# Patient Record
Sex: Female | Born: 1950
Health system: Southern US, Community
[De-identification: ages and names within clinical notes are randomized; demographics above are authoritative.]

## PROBLEM LIST (undated history)

## (undated) DIAGNOSIS — F329 Major depressive disorder, single episode, unspecified: Secondary | ICD-10-CM

## (undated) DIAGNOSIS — F419 Anxiety disorder, unspecified: Secondary | ICD-10-CM

## (undated) DIAGNOSIS — M79606 Pain in leg, unspecified: Secondary | ICD-10-CM

## (undated) DIAGNOSIS — E7800 Pure hypercholesterolemia, unspecified: Secondary | ICD-10-CM

## (undated) DIAGNOSIS — F32A Depression, unspecified: Secondary | ICD-10-CM

## (undated) DIAGNOSIS — I10 Essential (primary) hypertension: Secondary | ICD-10-CM

## (undated) DIAGNOSIS — G8929 Other chronic pain: Secondary | ICD-10-CM

## (undated) DIAGNOSIS — C439 Malignant melanoma of skin, unspecified: Secondary | ICD-10-CM

## (undated) DIAGNOSIS — I2510 Atherosclerotic heart disease of native coronary artery without angina pectoris: Secondary | ICD-10-CM

## (undated) DIAGNOSIS — K219 Gastro-esophageal reflux disease without esophagitis: Secondary | ICD-10-CM

## (undated) DIAGNOSIS — M25552 Pain in left hip: Secondary | ICD-10-CM

## (undated) DIAGNOSIS — M1990 Unspecified osteoarthritis, unspecified site: Secondary | ICD-10-CM

## (undated) DIAGNOSIS — E66812 Obesity, class 2: Secondary | ICD-10-CM

## (undated) DIAGNOSIS — M545 Low back pain, unspecified: Secondary | ICD-10-CM

## (undated) DIAGNOSIS — E669 Obesity, unspecified: Secondary | ICD-10-CM

## (undated) DIAGNOSIS — M549 Dorsalgia, unspecified: Secondary | ICD-10-CM

## (undated) HISTORY — DX: Depression, unspecified: F32.A

## (undated) HISTORY — DX: Pure hypercholesterolemia, unspecified: E78.00

## (undated) HISTORY — DX: Essential (primary) hypertension: I10

## (undated) HISTORY — PX: BACK SURGERY: SHX140

## (undated) HISTORY — DX: Anxiety disorder, unspecified: F41.9

## (undated) HISTORY — PX: MELANOMA EXCISION: SHX5266

## (undated) HISTORY — PX: TUBAL LIGATION: SHX77

## (undated) HISTORY — PX: DILATION AND CURETTAGE OF UTERUS: SHX78

## (undated) HISTORY — PX: POSTERIOR FUSION CERVICAL SPINE: SUR628

## (undated) HISTORY — PX: OOPHORECTOMY: SHX86

## (undated) HISTORY — DX: Major depressive disorder, single episode, unspecified: F32.9

---

## 1998-11-08 ENCOUNTER — Emergency Department (HOSPITAL_COMMUNITY): Admission: EM | Admit: 1998-11-08 | Discharge: 1998-11-08 | Payer: Self-pay | Admitting: *Deleted

## 1998-11-09 ENCOUNTER — Encounter: Payer: Self-pay | Admitting: Emergency Medicine

## 2000-05-04 ENCOUNTER — Other Ambulatory Visit: Admission: RE | Admit: 2000-05-04 | Discharge: 2000-05-04 | Payer: Self-pay | Admitting: Obstetrics and Gynecology

## 2000-10-30 ENCOUNTER — Encounter: Payer: Self-pay | Admitting: Obstetrics and Gynecology

## 2000-10-30 ENCOUNTER — Ambulatory Visit (HOSPITAL_COMMUNITY): Admission: RE | Admit: 2000-10-30 | Discharge: 2000-10-30 | Payer: Self-pay | Admitting: Obstetrics and Gynecology

## 2009-07-11 DIAGNOSIS — F329 Major depressive disorder, single episode, unspecified: Secondary | ICD-10-CM

## 2009-07-11 DIAGNOSIS — G4700 Insomnia, unspecified: Secondary | ICD-10-CM

## 2009-07-11 DIAGNOSIS — R059 Cough, unspecified: Secondary | ICD-10-CM

## 2009-07-11 DIAGNOSIS — F411 Generalized anxiety disorder: Secondary | ICD-10-CM

## 2009-07-11 DIAGNOSIS — E785 Hyperlipidemia, unspecified: Secondary | ICD-10-CM | POA: Insufficient documentation

## 2009-07-11 DIAGNOSIS — I10 Essential (primary) hypertension: Secondary | ICD-10-CM

## 2009-07-11 DIAGNOSIS — F3289 Other specified depressive episodes: Secondary | ICD-10-CM | POA: Insufficient documentation

## 2009-07-11 DIAGNOSIS — R05 Cough: Secondary | ICD-10-CM

## 2009-07-11 DIAGNOSIS — G5600 Carpal tunnel syndrome, unspecified upper limb: Secondary | ICD-10-CM

## 2009-07-11 DIAGNOSIS — G2581 Restless legs syndrome: Secondary | ICD-10-CM

## 2009-07-11 DIAGNOSIS — M549 Dorsalgia, unspecified: Secondary | ICD-10-CM

## 2009-07-11 DIAGNOSIS — K219 Gastro-esophageal reflux disease without esophagitis: Secondary | ICD-10-CM | POA: Insufficient documentation

## 2009-07-11 HISTORY — DX: Carpal tunnel syndrome, unspecified upper limb: G56.00

## 2009-07-11 HISTORY — DX: Insomnia, unspecified: G47.00

## 2009-07-11 HISTORY — DX: Generalized anxiety disorder: F41.1

## 2009-07-11 HISTORY — DX: Essential (primary) hypertension: I10

## 2009-07-11 HISTORY — DX: Restless legs syndrome: G25.81

## 2009-07-11 HISTORY — DX: Dorsalgia, unspecified: M54.9

## 2009-07-11 HISTORY — DX: Cough, unspecified: R05.9

## 2009-07-12 ENCOUNTER — Ambulatory Visit: Payer: Self-pay | Admitting: Pulmonary Disease

## 2009-07-13 ENCOUNTER — Encounter: Payer: Self-pay | Admitting: Pulmonary Disease

## 2009-07-24 ENCOUNTER — Telehealth (INDEPENDENT_AMBULATORY_CARE_PROVIDER_SITE_OTHER): Payer: Self-pay | Admitting: *Deleted

## 2012-01-22 ENCOUNTER — Institutional Professional Consult (permissible substitution): Payer: Self-pay | Admitting: Internal Medicine

## 2013-03-11 DIAGNOSIS — M5030 Other cervical disc degeneration, unspecified cervical region: Secondary | ICD-10-CM | POA: Insufficient documentation

## 2013-03-11 DIAGNOSIS — Z79891 Long term (current) use of opiate analgesic: Secondary | ICD-10-CM | POA: Insufficient documentation

## 2013-03-28 DIAGNOSIS — M5416 Radiculopathy, lumbar region: Secondary | ICD-10-CM | POA: Insufficient documentation

## 2014-12-27 DIAGNOSIS — N760 Acute vaginitis: Secondary | ICD-10-CM | POA: Diagnosis not present

## 2014-12-27 DIAGNOSIS — I10 Essential (primary) hypertension: Secondary | ICD-10-CM | POA: Diagnosis not present

## 2014-12-27 DIAGNOSIS — E8881 Metabolic syndrome: Secondary | ICD-10-CM | POA: Diagnosis not present

## 2014-12-27 DIAGNOSIS — K219 Gastro-esophageal reflux disease without esophagitis: Secondary | ICD-10-CM | POA: Diagnosis not present

## 2014-12-27 DIAGNOSIS — F419 Anxiety disorder, unspecified: Secondary | ICD-10-CM | POA: Diagnosis not present

## 2014-12-27 DIAGNOSIS — E780 Pure hypercholesterolemia: Secondary | ICD-10-CM | POA: Diagnosis not present

## 2014-12-27 DIAGNOSIS — K5900 Constipation, unspecified: Secondary | ICD-10-CM | POA: Diagnosis not present

## 2014-12-27 DIAGNOSIS — G4700 Insomnia, unspecified: Secondary | ICD-10-CM | POA: Diagnosis not present

## 2015-01-24 DIAGNOSIS — K5900 Constipation, unspecified: Secondary | ICD-10-CM | POA: Diagnosis not present

## 2015-01-24 DIAGNOSIS — I10 Essential (primary) hypertension: Secondary | ICD-10-CM | POA: Diagnosis not present

## 2015-01-24 DIAGNOSIS — J209 Acute bronchitis, unspecified: Secondary | ICD-10-CM | POA: Diagnosis not present

## 2015-01-24 DIAGNOSIS — E780 Pure hypercholesterolemia: Secondary | ICD-10-CM | POA: Diagnosis not present

## 2015-01-24 DIAGNOSIS — G4700 Insomnia, unspecified: Secondary | ICD-10-CM | POA: Diagnosis not present

## 2015-01-24 DIAGNOSIS — E8881 Metabolic syndrome: Secondary | ICD-10-CM | POA: Diagnosis not present

## 2015-01-24 DIAGNOSIS — K219 Gastro-esophageal reflux disease without esophagitis: Secondary | ICD-10-CM | POA: Diagnosis not present

## 2015-01-24 DIAGNOSIS — F419 Anxiety disorder, unspecified: Secondary | ICD-10-CM | POA: Diagnosis not present

## 2015-02-21 DIAGNOSIS — I10 Essential (primary) hypertension: Secondary | ICD-10-CM | POA: Diagnosis not present

## 2015-02-21 DIAGNOSIS — J209 Acute bronchitis, unspecified: Secondary | ICD-10-CM | POA: Diagnosis not present

## 2015-02-21 DIAGNOSIS — K5900 Constipation, unspecified: Secondary | ICD-10-CM | POA: Diagnosis not present

## 2015-02-21 DIAGNOSIS — K219 Gastro-esophageal reflux disease without esophagitis: Secondary | ICD-10-CM | POA: Diagnosis not present

## 2015-02-21 DIAGNOSIS — M549 Dorsalgia, unspecified: Secondary | ICD-10-CM | POA: Diagnosis not present

## 2015-02-21 DIAGNOSIS — F419 Anxiety disorder, unspecified: Secondary | ICD-10-CM | POA: Diagnosis not present

## 2015-02-21 DIAGNOSIS — E8881 Metabolic syndrome: Secondary | ICD-10-CM | POA: Diagnosis not present

## 2015-02-21 DIAGNOSIS — E780 Pure hypercholesterolemia: Secondary | ICD-10-CM | POA: Diagnosis not present

## 2015-03-05 DIAGNOSIS — M25512 Pain in left shoulder: Secondary | ICD-10-CM | POA: Diagnosis not present

## 2015-03-05 DIAGNOSIS — S199XXA Unspecified injury of neck, initial encounter: Secondary | ICD-10-CM | POA: Diagnosis not present

## 2015-03-05 DIAGNOSIS — M545 Low back pain: Secondary | ICD-10-CM | POA: Diagnosis not present

## 2015-03-05 DIAGNOSIS — S161XXA Strain of muscle, fascia and tendon at neck level, initial encounter: Secondary | ICD-10-CM | POA: Diagnosis not present

## 2015-03-05 DIAGNOSIS — M542 Cervicalgia: Secondary | ICD-10-CM | POA: Diagnosis not present

## 2015-03-05 DIAGNOSIS — S3992XA Unspecified injury of lower back, initial encounter: Secondary | ICD-10-CM | POA: Diagnosis not present

## 2015-03-05 DIAGNOSIS — S4992XA Unspecified injury of left shoulder and upper arm, initial encounter: Secondary | ICD-10-CM | POA: Diagnosis not present

## 2015-03-05 DIAGNOSIS — S39012A Strain of muscle, fascia and tendon of lower back, initial encounter: Secondary | ICD-10-CM | POA: Diagnosis not present

## 2015-03-05 DIAGNOSIS — E780 Pure hypercholesterolemia: Secondary | ICD-10-CM | POA: Diagnosis not present

## 2015-03-21 DIAGNOSIS — I10 Essential (primary) hypertension: Secondary | ICD-10-CM | POA: Diagnosis not present

## 2015-03-21 DIAGNOSIS — F329 Major depressive disorder, single episode, unspecified: Secondary | ICD-10-CM | POA: Diagnosis not present

## 2015-03-21 DIAGNOSIS — F419 Anxiety disorder, unspecified: Secondary | ICD-10-CM | POA: Diagnosis not present

## 2015-03-21 DIAGNOSIS — K5909 Other constipation: Secondary | ICD-10-CM | POA: Diagnosis not present

## 2015-03-21 DIAGNOSIS — K219 Gastro-esophageal reflux disease without esophagitis: Secondary | ICD-10-CM | POA: Diagnosis not present

## 2015-03-21 DIAGNOSIS — E780 Pure hypercholesterolemia: Secondary | ICD-10-CM | POA: Diagnosis not present

## 2015-03-21 DIAGNOSIS — E8881 Metabolic syndrome: Secondary | ICD-10-CM | POA: Diagnosis not present

## 2015-03-21 DIAGNOSIS — G4700 Insomnia, unspecified: Secondary | ICD-10-CM | POA: Diagnosis not present

## 2015-04-06 ENCOUNTER — Encounter: Payer: Self-pay | Admitting: Diagnostic Neuroimaging

## 2015-04-06 ENCOUNTER — Ambulatory Visit (INDEPENDENT_AMBULATORY_CARE_PROVIDER_SITE_OTHER): Payer: Self-pay | Admitting: Diagnostic Neuroimaging

## 2015-04-06 DIAGNOSIS — S161XXA Strain of muscle, fascia and tendon at neck level, initial encounter: Secondary | ICD-10-CM

## 2015-04-06 DIAGNOSIS — M5416 Radiculopathy, lumbar region: Secondary | ICD-10-CM

## 2015-04-06 DIAGNOSIS — M5010 Cervical disc disorder with radiculopathy, unspecified cervical region: Secondary | ICD-10-CM
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
## 2015-04-18 DIAGNOSIS — M549 Dorsalgia, unspecified: Secondary | ICD-10-CM | POA: Diagnosis not present

## 2015-04-18 DIAGNOSIS — I10 Essential (primary) hypertension: Secondary | ICD-10-CM | POA: Diagnosis not present

## 2015-04-18 DIAGNOSIS — E8881 Metabolic syndrome: Secondary | ICD-10-CM | POA: Diagnosis not present

## 2015-04-18 DIAGNOSIS — E780 Pure hypercholesterolemia: Secondary | ICD-10-CM | POA: Diagnosis not present

## 2015-04-18 DIAGNOSIS — F329 Major depressive disorder, single episode, unspecified: Secondary | ICD-10-CM | POA: Diagnosis not present

## 2015-04-18 DIAGNOSIS — K5909 Other constipation: Secondary | ICD-10-CM | POA: Diagnosis not present

## 2015-04-18 DIAGNOSIS — M542 Cervicalgia: Secondary | ICD-10-CM | POA: Diagnosis not present

## 2015-04-18 DIAGNOSIS — K219 Gastro-esophageal reflux disease without esophagitis: Secondary | ICD-10-CM | POA: Diagnosis not present

## 2015-05-09 ENCOUNTER — Telehealth: Payer: Self-pay | Admitting: Diagnostic Neuroimaging
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
## 2015-05-09 NOTE — Telephone Encounter (Signed)
Patient is calling because she is scheduled for an MRI of the cervical spine and lumbar spine next Sunday. The patient is still having pain in her lt. hip, lt. leg and both shoulders and was wondering if these can also be included in the MRI. Please call and advise. Thank you.

## 2015-05-13 ENCOUNTER — Ambulatory Visit
Admission: RE | Admit: 2015-05-13 | Discharge: 2015-05-13 | Disposition: A | Discharge status: Home / Self Care | Payer: Commercial Managed Care - HMO | Source: Ambulatory Visit | Attending: Diagnostic Neuroimaging | Admitting: Diagnostic Neuroimaging

## 2015-05-13 DIAGNOSIS — S161XXA Strain of muscle, fascia and tendon at neck level, initial encounter: Secondary | ICD-10-CM | POA: Diagnosis not present

## 2015-05-13 DIAGNOSIS — M5416 Radiculopathy, lumbar region: Secondary | ICD-10-CM | POA: Diagnosis not present

## 2015-05-13 DIAGNOSIS — M5010 Cervical disc disorder with radiculopathy, unspecified cervical region: Secondary | ICD-10-CM

## 2015-05-13 MED ORDER — GADOBENATE DIMEGLUMINE 529 MG/ML IV SOLN
15.0000 mL | Freq: Once | INTRAVENOUS | Status: AC | PRN
  Administered 2015-05-13: 15 mL via INTRAVENOUS

## 2015-05-16 DIAGNOSIS — F329 Major depressive disorder, single episode, unspecified: Secondary | ICD-10-CM | POA: Diagnosis not present

## 2015-05-16 DIAGNOSIS — M549 Dorsalgia, unspecified: Secondary | ICD-10-CM | POA: Diagnosis not present

## 2015-05-16 DIAGNOSIS — K219 Gastro-esophageal reflux disease without esophagitis: Secondary | ICD-10-CM | POA: Diagnosis not present

## 2015-05-16 DIAGNOSIS — M542 Cervicalgia: Secondary | ICD-10-CM | POA: Diagnosis not present

## 2015-05-16 DIAGNOSIS — K5909 Other constipation: Secondary | ICD-10-CM | POA: Diagnosis not present

## 2015-05-16 DIAGNOSIS — I10 Essential (primary) hypertension: Secondary | ICD-10-CM | POA: Diagnosis not present

## 2015-05-16 DIAGNOSIS — E8881 Metabolic syndrome: Secondary | ICD-10-CM | POA: Diagnosis not present

## 2015-05-16 DIAGNOSIS — E780 Pure hypercholesterolemia: Secondary | ICD-10-CM | POA: Diagnosis not present

## 2015-05-23 ENCOUNTER — Ambulatory Visit: Payer: Self-pay | Admitting: Diagnostic Neuroimaging

## 2015-05-28 ENCOUNTER — Encounter: Payer: Self-pay | Admitting: Diagnostic Neuroimaging

## 2015-05-28 ENCOUNTER — Ambulatory Visit (INDEPENDENT_AMBULATORY_CARE_PROVIDER_SITE_OTHER): Payer: Self-pay | Admitting: Diagnostic Neuroimaging

## 2015-05-28 VITALS — BP 128/76 | HR 92 | Ht 59.0 in | Wt 164.2 lb

## 2015-05-28 DIAGNOSIS — M5416 Radiculopathy, lumbar region: Secondary | ICD-10-CM

## 2015-05-28 DIAGNOSIS — M5010 Cervical disc disorder with radiculopathy, unspecified cervical region: Secondary | ICD-10-CM
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
## 2015-06-14 DIAGNOSIS — J209 Acute bronchitis, unspecified: Secondary | ICD-10-CM | POA: Diagnosis not present

## 2015-06-14 DIAGNOSIS — E8881 Metabolic syndrome: Secondary | ICD-10-CM | POA: Diagnosis not present

## 2015-06-14 DIAGNOSIS — K219 Gastro-esophageal reflux disease without esophagitis: Secondary | ICD-10-CM | POA: Diagnosis not present

## 2015-06-14 DIAGNOSIS — K5909 Other constipation: Secondary | ICD-10-CM | POA: Diagnosis not present

## 2015-06-14 DIAGNOSIS — E780 Pure hypercholesterolemia: Secondary | ICD-10-CM | POA: Diagnosis not present

## 2015-06-14 DIAGNOSIS — I10 Essential (primary) hypertension: Secondary | ICD-10-CM | POA: Diagnosis not present

## 2015-06-14 DIAGNOSIS — M542 Cervicalgia: Secondary | ICD-10-CM | POA: Diagnosis not present

## 2015-06-14 DIAGNOSIS — M549 Dorsalgia, unspecified: Secondary | ICD-10-CM | POA: Diagnosis not present

## 2015-07-12 DIAGNOSIS — K219 Gastro-esophageal reflux disease without esophagitis: Secondary | ICD-10-CM | POA: Diagnosis not present

## 2015-07-12 DIAGNOSIS — M549 Dorsalgia, unspecified: Secondary | ICD-10-CM | POA: Diagnosis not present

## 2015-07-12 DIAGNOSIS — I10 Essential (primary) hypertension: Secondary | ICD-10-CM | POA: Diagnosis not present

## 2015-07-12 DIAGNOSIS — E8881 Metabolic syndrome: Secondary | ICD-10-CM | POA: Diagnosis not present

## 2015-07-12 DIAGNOSIS — L0390 Cellulitis, unspecified: Secondary | ICD-10-CM | POA: Diagnosis not present

## 2015-07-12 DIAGNOSIS — E780 Pure hypercholesterolemia: Secondary | ICD-10-CM | POA: Diagnosis not present

## 2015-07-12 DIAGNOSIS — M542 Cervicalgia: Secondary | ICD-10-CM | POA: Diagnosis not present

## 2015-07-12 DIAGNOSIS — K5909 Other constipation: Secondary | ICD-10-CM | POA: Diagnosis not present

## 2015-08-10 DIAGNOSIS — K219 Gastro-esophageal reflux disease without esophagitis: Secondary | ICD-10-CM | POA: Diagnosis not present

## 2015-08-10 DIAGNOSIS — F329 Major depressive disorder, single episode, unspecified: Secondary | ICD-10-CM | POA: Diagnosis not present

## 2015-08-10 DIAGNOSIS — E780 Pure hypercholesterolemia: Secondary | ICD-10-CM | POA: Diagnosis not present

## 2015-08-10 DIAGNOSIS — M542 Cervicalgia: Secondary | ICD-10-CM | POA: Diagnosis not present

## 2015-08-10 DIAGNOSIS — I10 Essential (primary) hypertension: Secondary | ICD-10-CM | POA: Diagnosis not present

## 2015-08-10 DIAGNOSIS — K5909 Other constipation: Secondary | ICD-10-CM | POA: Diagnosis not present

## 2015-08-10 DIAGNOSIS — M549 Dorsalgia, unspecified: Secondary | ICD-10-CM | POA: Diagnosis not present

## 2015-08-10 DIAGNOSIS — E8881 Metabolic syndrome: Secondary | ICD-10-CM | POA: Diagnosis not present

## 2015-08-20 DIAGNOSIS — M6281 Muscle weakness (generalized): Secondary | ICD-10-CM | POA: Diagnosis not present

## 2015-08-20 DIAGNOSIS — S161XXS Strain of muscle, fascia and tendon at neck level, sequela: Secondary | ICD-10-CM | POA: Diagnosis not present

## 2015-09-03 DIAGNOSIS — S161XXS Strain of muscle, fascia and tendon at neck level, sequela: Secondary | ICD-10-CM | POA: Diagnosis not present

## 2015-09-03 DIAGNOSIS — M6281 Muscle weakness (generalized): Secondary | ICD-10-CM | POA: Diagnosis not present

## 2015-09-07 DIAGNOSIS — I10 Essential (primary) hypertension: Secondary | ICD-10-CM | POA: Diagnosis not present

## 2015-09-07 DIAGNOSIS — K5909 Other constipation: Secondary | ICD-10-CM | POA: Diagnosis not present

## 2015-09-07 DIAGNOSIS — F419 Anxiety disorder, unspecified: Secondary | ICD-10-CM | POA: Diagnosis not present

## 2015-09-07 DIAGNOSIS — E8881 Metabolic syndrome: Secondary | ICD-10-CM | POA: Diagnosis not present

## 2015-09-07 DIAGNOSIS — M549 Dorsalgia, unspecified: Secondary | ICD-10-CM | POA: Diagnosis not present

## 2015-09-07 DIAGNOSIS — F329 Major depressive disorder, single episode, unspecified: Secondary | ICD-10-CM | POA: Diagnosis not present

## 2015-09-07 DIAGNOSIS — K219 Gastro-esophageal reflux disease without esophagitis: Secondary | ICD-10-CM | POA: Diagnosis not present

## 2015-09-07 DIAGNOSIS — E780 Pure hypercholesterolemia: Secondary | ICD-10-CM | POA: Diagnosis not present

## 2015-09-10 DIAGNOSIS — S161XXS Strain of muscle, fascia and tendon at neck level, sequela: Secondary | ICD-10-CM | POA: Diagnosis not present

## 2015-09-10 DIAGNOSIS — M6281 Muscle weakness (generalized): Secondary | ICD-10-CM | POA: Diagnosis not present

## 2015-10-05 DIAGNOSIS — K219 Gastro-esophageal reflux disease without esophagitis: Secondary | ICD-10-CM | POA: Diagnosis not present

## 2015-10-05 DIAGNOSIS — I10 Essential (primary) hypertension: Secondary | ICD-10-CM | POA: Diagnosis not present

## 2015-10-05 DIAGNOSIS — M542 Cervicalgia: Secondary | ICD-10-CM | POA: Diagnosis not present

## 2015-10-05 DIAGNOSIS — M549 Dorsalgia, unspecified: Secondary | ICD-10-CM | POA: Diagnosis not present

## 2015-10-05 DIAGNOSIS — E7800 Pure hypercholesterolemia, unspecified: Secondary | ICD-10-CM | POA: Diagnosis not present

## 2015-10-05 DIAGNOSIS — K5909 Other constipation: Secondary | ICD-10-CM | POA: Diagnosis not present

## 2015-10-05 DIAGNOSIS — J209 Acute bronchitis, unspecified: Secondary | ICD-10-CM | POA: Diagnosis not present

## 2015-10-05 DIAGNOSIS — E8881 Metabolic syndrome: Secondary | ICD-10-CM | POA: Diagnosis not present

## 2015-11-05 DIAGNOSIS — E8881 Metabolic syndrome: Secondary | ICD-10-CM | POA: Diagnosis not present

## 2015-11-05 DIAGNOSIS — K219 Gastro-esophageal reflux disease without esophagitis: Secondary | ICD-10-CM | POA: Diagnosis not present

## 2015-11-05 DIAGNOSIS — M549 Dorsalgia, unspecified: Secondary | ICD-10-CM | POA: Diagnosis not present

## 2015-11-05 DIAGNOSIS — K5909 Other constipation: Secondary | ICD-10-CM | POA: Diagnosis not present

## 2015-11-05 DIAGNOSIS — Z1389 Encounter for screening for other disorder: Secondary | ICD-10-CM | POA: Diagnosis not present

## 2015-11-05 DIAGNOSIS — E7800 Pure hypercholesterolemia, unspecified: Secondary | ICD-10-CM | POA: Diagnosis not present

## 2015-11-05 DIAGNOSIS — I10 Essential (primary) hypertension: Secondary | ICD-10-CM | POA: Diagnosis not present

## 2015-11-05 DIAGNOSIS — N390 Urinary tract infection, site not specified: Secondary | ICD-10-CM | POA: Diagnosis not present

## 2015-11-30 DIAGNOSIS — M549 Dorsalgia, unspecified: Secondary | ICD-10-CM | POA: Diagnosis not present

## 2015-11-30 DIAGNOSIS — K5909 Other constipation: Secondary | ICD-10-CM | POA: Diagnosis not present

## 2015-11-30 DIAGNOSIS — K219 Gastro-esophageal reflux disease without esophagitis: Secondary | ICD-10-CM | POA: Diagnosis not present

## 2015-11-30 DIAGNOSIS — F419 Anxiety disorder, unspecified: Secondary | ICD-10-CM | POA: Diagnosis not present

## 2015-11-30 DIAGNOSIS — E7800 Pure hypercholesterolemia, unspecified: Secondary | ICD-10-CM | POA: Diagnosis not present

## 2015-11-30 DIAGNOSIS — E8881 Metabolic syndrome: Secondary | ICD-10-CM | POA: Diagnosis not present

## 2015-11-30 DIAGNOSIS — N390 Urinary tract infection, site not specified: Secondary | ICD-10-CM | POA: Diagnosis not present

## 2015-11-30 DIAGNOSIS — I10 Essential (primary) hypertension: Secondary | ICD-10-CM | POA: Diagnosis not present

## 2015-12-28 DIAGNOSIS — E8881 Metabolic syndrome: Secondary | ICD-10-CM | POA: Diagnosis not present

## 2015-12-28 DIAGNOSIS — E7800 Pure hypercholesterolemia, unspecified: Secondary | ICD-10-CM | POA: Diagnosis not present

## 2015-12-28 DIAGNOSIS — M549 Dorsalgia, unspecified: Secondary | ICD-10-CM | POA: Diagnosis not present

## 2015-12-28 DIAGNOSIS — K219 Gastro-esophageal reflux disease without esophagitis: Secondary | ICD-10-CM | POA: Diagnosis not present

## 2015-12-28 DIAGNOSIS — I10 Essential (primary) hypertension: Secondary | ICD-10-CM | POA: Diagnosis not present

## 2015-12-28 DIAGNOSIS — K5909 Other constipation: Secondary | ICD-10-CM | POA: Diagnosis not present

## 2015-12-28 DIAGNOSIS — F419 Anxiety disorder, unspecified: Secondary | ICD-10-CM | POA: Diagnosis not present

## 2015-12-28 DIAGNOSIS — G4700 Insomnia, unspecified: Secondary | ICD-10-CM | POA: Diagnosis not present

## 2015-12-28 DIAGNOSIS — J209 Acute bronchitis, unspecified: Secondary | ICD-10-CM | POA: Diagnosis not present

## 2016-01-25 DIAGNOSIS — K5909 Other constipation: Secondary | ICD-10-CM | POA: Diagnosis not present

## 2016-01-25 DIAGNOSIS — E7800 Pure hypercholesterolemia, unspecified: Secondary | ICD-10-CM | POA: Diagnosis not present

## 2016-01-25 DIAGNOSIS — J209 Acute bronchitis, unspecified: Secondary | ICD-10-CM | POA: Diagnosis not present

## 2016-01-25 DIAGNOSIS — G4700 Insomnia, unspecified: Secondary | ICD-10-CM | POA: Diagnosis not present

## 2016-01-25 DIAGNOSIS — E8881 Metabolic syndrome: Secondary | ICD-10-CM | POA: Diagnosis not present

## 2016-01-25 DIAGNOSIS — F419 Anxiety disorder, unspecified: Secondary | ICD-10-CM | POA: Diagnosis not present

## 2016-01-25 DIAGNOSIS — I10 Essential (primary) hypertension: Secondary | ICD-10-CM | POA: Diagnosis not present

## 2016-01-25 DIAGNOSIS — K219 Gastro-esophageal reflux disease without esophagitis: Secondary | ICD-10-CM | POA: Diagnosis not present

## 2016-01-25 DIAGNOSIS — M549 Dorsalgia, unspecified: Secondary | ICD-10-CM | POA: Diagnosis not present

## 2016-02-21 DIAGNOSIS — I10 Essential (primary) hypertension: Secondary | ICD-10-CM | POA: Diagnosis not present

## 2016-02-21 DIAGNOSIS — K5909 Other constipation: Secondary | ICD-10-CM | POA: Diagnosis not present

## 2016-02-21 DIAGNOSIS — K219 Gastro-esophageal reflux disease without esophagitis: Secondary | ICD-10-CM | POA: Diagnosis not present

## 2016-02-21 DIAGNOSIS — M549 Dorsalgia, unspecified: Secondary | ICD-10-CM | POA: Diagnosis not present

## 2016-02-21 DIAGNOSIS — E8881 Metabolic syndrome: Secondary | ICD-10-CM | POA: Diagnosis not present

## 2016-02-21 DIAGNOSIS — F419 Anxiety disorder, unspecified: Secondary | ICD-10-CM | POA: Diagnosis not present

## 2016-02-21 DIAGNOSIS — G4700 Insomnia, unspecified: Secondary | ICD-10-CM | POA: Diagnosis not present

## 2016-02-21 DIAGNOSIS — N761 Subacute and chronic vaginitis: Secondary | ICD-10-CM | POA: Diagnosis not present

## 2016-02-21 DIAGNOSIS — E7800 Pure hypercholesterolemia, unspecified: Secondary | ICD-10-CM | POA: Diagnosis not present

## 2016-03-19 DIAGNOSIS — N904 Leukoplakia of vulva: Secondary | ICD-10-CM | POA: Insufficient documentation

## 2016-03-19 DIAGNOSIS — B373 Candidiasis of vulva and vagina: Secondary | ICD-10-CM | POA: Diagnosis not present

## 2016-03-19 DIAGNOSIS — B379 Candidiasis, unspecified: Secondary | ICD-10-CM | POA: Diagnosis not present

## 2016-03-20 DIAGNOSIS — F329 Major depressive disorder, single episode, unspecified: Secondary | ICD-10-CM | POA: Diagnosis not present

## 2016-03-20 DIAGNOSIS — K219 Gastro-esophageal reflux disease without esophagitis: Secondary | ICD-10-CM | POA: Diagnosis not present

## 2016-03-20 DIAGNOSIS — M549 Dorsalgia, unspecified: Secondary | ICD-10-CM | POA: Diagnosis not present

## 2016-03-20 DIAGNOSIS — E8881 Metabolic syndrome: Secondary | ICD-10-CM | POA: Diagnosis not present

## 2016-03-20 DIAGNOSIS — G4700 Insomnia, unspecified: Secondary | ICD-10-CM | POA: Diagnosis not present

## 2016-03-20 DIAGNOSIS — F419 Anxiety disorder, unspecified: Secondary | ICD-10-CM | POA: Diagnosis not present

## 2016-03-20 DIAGNOSIS — I10 Essential (primary) hypertension: Secondary | ICD-10-CM | POA: Diagnosis not present

## 2016-03-20 DIAGNOSIS — K5909 Other constipation: Secondary | ICD-10-CM | POA: Diagnosis not present

## 2016-03-20 DIAGNOSIS — E7800 Pure hypercholesterolemia, unspecified: Secondary | ICD-10-CM | POA: Diagnosis not present

## 2016-04-17 DIAGNOSIS — K5909 Other constipation: Secondary | ICD-10-CM | POA: Diagnosis not present

## 2016-04-17 DIAGNOSIS — M549 Dorsalgia, unspecified: Secondary | ICD-10-CM | POA: Diagnosis not present

## 2016-04-17 DIAGNOSIS — E7800 Pure hypercholesterolemia, unspecified: Secondary | ICD-10-CM | POA: Diagnosis not present

## 2016-04-17 DIAGNOSIS — Z79891 Long term (current) use of opiate analgesic: Secondary | ICD-10-CM | POA: Diagnosis not present

## 2016-04-17 DIAGNOSIS — K219 Gastro-esophageal reflux disease without esophagitis: Secondary | ICD-10-CM | POA: Diagnosis not present

## 2016-04-17 DIAGNOSIS — R300 Dysuria: Secondary | ICD-10-CM | POA: Diagnosis not present

## 2016-04-17 DIAGNOSIS — I10 Essential (primary) hypertension: Secondary | ICD-10-CM | POA: Diagnosis not present

## 2016-04-17 DIAGNOSIS — E8881 Metabolic syndrome: Secondary | ICD-10-CM | POA: Diagnosis not present

## 2016-04-17 DIAGNOSIS — L0390 Cellulitis, unspecified: Secondary | ICD-10-CM | POA: Diagnosis not present

## 2016-05-15 DIAGNOSIS — G4700 Insomnia, unspecified: Secondary | ICD-10-CM | POA: Diagnosis not present

## 2016-05-15 DIAGNOSIS — K219 Gastro-esophageal reflux disease without esophagitis: Secondary | ICD-10-CM | POA: Diagnosis not present

## 2016-05-15 DIAGNOSIS — I10 Essential (primary) hypertension: Secondary | ICD-10-CM | POA: Diagnosis not present

## 2016-05-15 DIAGNOSIS — N390 Urinary tract infection, site not specified: Secondary | ICD-10-CM | POA: Diagnosis not present

## 2016-05-15 DIAGNOSIS — F419 Anxiety disorder, unspecified: Secondary | ICD-10-CM | POA: Diagnosis not present

## 2016-05-15 DIAGNOSIS — M549 Dorsalgia, unspecified: Secondary | ICD-10-CM | POA: Diagnosis not present

## 2016-05-15 DIAGNOSIS — E8881 Metabolic syndrome: Secondary | ICD-10-CM | POA: Diagnosis not present

## 2016-05-15 DIAGNOSIS — E7800 Pure hypercholesterolemia, unspecified: Secondary | ICD-10-CM | POA: Diagnosis not present

## 2016-05-15 DIAGNOSIS — K5909 Other constipation: Secondary | ICD-10-CM | POA: Diagnosis not present

## 2016-06-12 DIAGNOSIS — M549 Dorsalgia, unspecified: Secondary | ICD-10-CM | POA: Diagnosis not present

## 2016-06-12 DIAGNOSIS — E8881 Metabolic syndrome: Secondary | ICD-10-CM | POA: Diagnosis not present

## 2016-06-12 DIAGNOSIS — K219 Gastro-esophageal reflux disease without esophagitis: Secondary | ICD-10-CM | POA: Diagnosis not present

## 2016-06-12 DIAGNOSIS — F329 Major depressive disorder, single episode, unspecified: Secondary | ICD-10-CM | POA: Diagnosis not present

## 2016-06-12 DIAGNOSIS — G4700 Insomnia, unspecified: Secondary | ICD-10-CM | POA: Diagnosis not present

## 2016-06-12 DIAGNOSIS — E7800 Pure hypercholesterolemia, unspecified: Secondary | ICD-10-CM | POA: Diagnosis not present

## 2016-06-12 DIAGNOSIS — F419 Anxiety disorder, unspecified: Secondary | ICD-10-CM | POA: Diagnosis not present

## 2016-06-12 DIAGNOSIS — K5909 Other constipation: Secondary | ICD-10-CM | POA: Diagnosis not present

## 2016-06-12 DIAGNOSIS — I10 Essential (primary) hypertension: Secondary | ICD-10-CM | POA: Diagnosis not present

## 2016-07-06 DIAGNOSIS — R0602 Shortness of breath: Secondary | ICD-10-CM | POA: Diagnosis not present

## 2016-07-06 DIAGNOSIS — R0789 Other chest pain: Secondary | ICD-10-CM | POA: Diagnosis not present

## 2016-07-06 DIAGNOSIS — Z981 Arthrodesis status: Secondary | ICD-10-CM | POA: Diagnosis not present

## 2016-07-06 DIAGNOSIS — R079 Chest pain, unspecified: Secondary | ICD-10-CM | POA: Diagnosis not present

## 2016-07-06 DIAGNOSIS — M79605 Pain in left leg: Secondary | ICD-10-CM | POA: Diagnosis not present

## 2016-07-06 DIAGNOSIS — R42 Dizziness and giddiness: Secondary | ICD-10-CM | POA: Diagnosis not present

## 2016-07-10 DIAGNOSIS — E7800 Pure hypercholesterolemia, unspecified: Secondary | ICD-10-CM | POA: Diagnosis not present

## 2016-07-10 DIAGNOSIS — M549 Dorsalgia, unspecified: Secondary | ICD-10-CM | POA: Diagnosis not present

## 2016-07-10 DIAGNOSIS — K5909 Other constipation: Secondary | ICD-10-CM | POA: Diagnosis not present

## 2016-07-10 DIAGNOSIS — G4700 Insomnia, unspecified: Secondary | ICD-10-CM | POA: Diagnosis not present

## 2016-07-10 DIAGNOSIS — K219 Gastro-esophageal reflux disease without esophagitis: Secondary | ICD-10-CM | POA: Diagnosis not present

## 2016-07-10 DIAGNOSIS — F419 Anxiety disorder, unspecified: Secondary | ICD-10-CM | POA: Diagnosis not present

## 2016-07-10 DIAGNOSIS — E8881 Metabolic syndrome: Secondary | ICD-10-CM | POA: Diagnosis not present

## 2016-07-10 DIAGNOSIS — N390 Urinary tract infection, site not specified: Secondary | ICD-10-CM | POA: Diagnosis not present

## 2016-07-10 DIAGNOSIS — I10 Essential (primary) hypertension: Secondary | ICD-10-CM | POA: Diagnosis not present

## 2016-08-07 DIAGNOSIS — M549 Dorsalgia, unspecified: Secondary | ICD-10-CM | POA: Diagnosis not present

## 2016-08-07 DIAGNOSIS — E7800 Pure hypercholesterolemia, unspecified: Secondary | ICD-10-CM | POA: Diagnosis not present

## 2016-08-07 DIAGNOSIS — F419 Anxiety disorder, unspecified: Secondary | ICD-10-CM | POA: Diagnosis not present

## 2016-08-07 DIAGNOSIS — Z79899 Other long term (current) drug therapy: Secondary | ICD-10-CM | POA: Diagnosis not present

## 2016-08-07 DIAGNOSIS — I10 Essential (primary) hypertension: Secondary | ICD-10-CM | POA: Diagnosis not present

## 2016-08-07 DIAGNOSIS — E8881 Metabolic syndrome: Secondary | ICD-10-CM | POA: Diagnosis not present

## 2016-08-07 DIAGNOSIS — N390 Urinary tract infection, site not specified: Secondary | ICD-10-CM | POA: Diagnosis not present

## 2016-08-07 DIAGNOSIS — K5909 Other constipation: Secondary | ICD-10-CM | POA: Diagnosis not present

## 2016-08-07 DIAGNOSIS — K219 Gastro-esophageal reflux disease without esophagitis: Secondary | ICD-10-CM | POA: Diagnosis not present

## 2016-09-04 DIAGNOSIS — K5909 Other constipation: Secondary | ICD-10-CM | POA: Diagnosis not present

## 2016-09-04 DIAGNOSIS — N390 Urinary tract infection, site not specified: Secondary | ICD-10-CM | POA: Diagnosis not present

## 2016-09-04 DIAGNOSIS — M549 Dorsalgia, unspecified: Secondary | ICD-10-CM | POA: Diagnosis not present

## 2016-09-04 DIAGNOSIS — E7800 Pure hypercholesterolemia, unspecified: Secondary | ICD-10-CM | POA: Diagnosis not present

## 2016-09-04 DIAGNOSIS — E8881 Metabolic syndrome: Secondary | ICD-10-CM | POA: Diagnosis not present

## 2016-09-04 DIAGNOSIS — K219 Gastro-esophageal reflux disease without esophagitis: Secondary | ICD-10-CM | POA: Diagnosis not present

## 2016-09-04 DIAGNOSIS — G4700 Insomnia, unspecified: Secondary | ICD-10-CM | POA: Diagnosis not present

## 2016-09-04 DIAGNOSIS — F419 Anxiety disorder, unspecified: Secondary | ICD-10-CM | POA: Diagnosis not present

## 2016-09-04 DIAGNOSIS — I10 Essential (primary) hypertension: Secondary | ICD-10-CM | POA: Diagnosis not present

## 2016-10-02 DIAGNOSIS — K5909 Other constipation: Secondary | ICD-10-CM | POA: Diagnosis not present

## 2016-10-02 DIAGNOSIS — R079 Chest pain, unspecified: Secondary | ICD-10-CM | POA: Diagnosis not present

## 2016-10-02 DIAGNOSIS — E7800 Pure hypercholesterolemia, unspecified: Secondary | ICD-10-CM | POA: Diagnosis not present

## 2016-10-02 DIAGNOSIS — M549 Dorsalgia, unspecified: Secondary | ICD-10-CM | POA: Diagnosis not present

## 2016-10-02 DIAGNOSIS — R300 Dysuria: Secondary | ICD-10-CM | POA: Diagnosis not present

## 2016-10-02 DIAGNOSIS — F419 Anxiety disorder, unspecified: Secondary | ICD-10-CM | POA: Diagnosis not present

## 2016-10-02 DIAGNOSIS — M542 Cervicalgia: Secondary | ICD-10-CM | POA: Diagnosis not present

## 2016-10-02 DIAGNOSIS — I10 Essential (primary) hypertension: Secondary | ICD-10-CM | POA: Diagnosis not present

## 2016-10-02 DIAGNOSIS — K219 Gastro-esophageal reflux disease without esophagitis: Secondary | ICD-10-CM | POA: Diagnosis not present

## 2016-10-22 DIAGNOSIS — R079 Chest pain, unspecified: Secondary | ICD-10-CM | POA: Diagnosis not present

## 2016-10-22 DIAGNOSIS — E782 Mixed hyperlipidemia: Secondary | ICD-10-CM | POA: Diagnosis not present

## 2016-10-24 DIAGNOSIS — E782 Mixed hyperlipidemia: Secondary | ICD-10-CM | POA: Diagnosis not present

## 2016-10-24 DIAGNOSIS — R079 Chest pain, unspecified: Secondary | ICD-10-CM | POA: Diagnosis not present

## 2016-10-30 DIAGNOSIS — Z23 Encounter for immunization: Secondary | ICD-10-CM | POA: Diagnosis not present

## 2016-10-30 DIAGNOSIS — K5909 Other constipation: Secondary | ICD-10-CM | POA: Diagnosis not present

## 2016-10-30 DIAGNOSIS — Z79891 Long term (current) use of opiate analgesic: Secondary | ICD-10-CM | POA: Diagnosis not present

## 2016-10-30 DIAGNOSIS — E8881 Metabolic syndrome: Secondary | ICD-10-CM | POA: Diagnosis not present

## 2016-10-30 DIAGNOSIS — G4700 Insomnia, unspecified: Secondary | ICD-10-CM | POA: Diagnosis not present

## 2016-10-30 DIAGNOSIS — E7800 Pure hypercholesterolemia, unspecified: Secondary | ICD-10-CM | POA: Diagnosis not present

## 2016-10-30 DIAGNOSIS — M549 Dorsalgia, unspecified: Secondary | ICD-10-CM | POA: Diagnosis not present

## 2016-10-30 DIAGNOSIS — K219 Gastro-esophageal reflux disease without esophagitis: Secondary | ICD-10-CM | POA: Diagnosis not present

## 2016-10-30 DIAGNOSIS — I10 Essential (primary) hypertension: Secondary | ICD-10-CM | POA: Diagnosis not present

## 2016-11-04 DIAGNOSIS — R079 Chest pain, unspecified: Secondary | ICD-10-CM | POA: Diagnosis not present

## 2016-11-05 DIAGNOSIS — F419 Anxiety disorder, unspecified: Secondary | ICD-10-CM | POA: Diagnosis not present

## 2016-11-05 DIAGNOSIS — R079 Chest pain, unspecified: Secondary | ICD-10-CM | POA: Diagnosis not present

## 2016-11-05 DIAGNOSIS — E782 Mixed hyperlipidemia: Secondary | ICD-10-CM | POA: Diagnosis not present

## 2016-11-05 DIAGNOSIS — K219 Gastro-esophageal reflux disease without esophagitis: Secondary | ICD-10-CM | POA: Diagnosis not present

## 2016-11-05 DIAGNOSIS — Z79899 Other long term (current) drug therapy: Secondary | ICD-10-CM | POA: Diagnosis not present

## 2016-11-05 DIAGNOSIS — G4700 Insomnia, unspecified: Secondary | ICD-10-CM | POA: Diagnosis not present

## 2016-11-05 DIAGNOSIS — Z981 Arthrodesis status: Secondary | ICD-10-CM | POA: Diagnosis not present

## 2016-11-05 DIAGNOSIS — I7090 Unspecified atherosclerosis: Secondary | ICD-10-CM | POA: Diagnosis not present

## 2016-11-05 DIAGNOSIS — I2510 Atherosclerotic heart disease of native coronary artery without angina pectoris: Secondary | ICD-10-CM | POA: Diagnosis not present

## 2016-11-26 DIAGNOSIS — F419 Anxiety disorder, unspecified: Secondary | ICD-10-CM | POA: Diagnosis not present

## 2016-11-26 DIAGNOSIS — M542 Cervicalgia: Secondary | ICD-10-CM | POA: Diagnosis not present

## 2016-11-26 DIAGNOSIS — I10 Essential (primary) hypertension: Secondary | ICD-10-CM | POA: Diagnosis not present

## 2016-11-26 DIAGNOSIS — K5909 Other constipation: Secondary | ICD-10-CM | POA: Diagnosis not present

## 2016-11-26 DIAGNOSIS — K219 Gastro-esophageal reflux disease without esophagitis: Secondary | ICD-10-CM | POA: Diagnosis not present

## 2016-11-26 DIAGNOSIS — E8881 Metabolic syndrome: Secondary | ICD-10-CM | POA: Diagnosis not present

## 2016-11-26 DIAGNOSIS — Z1389 Encounter for screening for other disorder: Secondary | ICD-10-CM | POA: Diagnosis not present

## 2016-11-26 DIAGNOSIS — E7800 Pure hypercholesterolemia, unspecified: Secondary | ICD-10-CM | POA: Diagnosis not present

## 2016-11-26 DIAGNOSIS — G4700 Insomnia, unspecified: Secondary | ICD-10-CM | POA: Diagnosis not present

## 2016-12-24 DIAGNOSIS — I10 Essential (primary) hypertension: Secondary | ICD-10-CM | POA: Diagnosis not present

## 2016-12-24 DIAGNOSIS — M549 Dorsalgia, unspecified: Secondary | ICD-10-CM | POA: Diagnosis not present

## 2016-12-24 DIAGNOSIS — F419 Anxiety disorder, unspecified: Secondary | ICD-10-CM | POA: Diagnosis not present

## 2016-12-24 DIAGNOSIS — M542 Cervicalgia: Secondary | ICD-10-CM | POA: Diagnosis not present

## 2016-12-24 DIAGNOSIS — K5909 Other constipation: Secondary | ICD-10-CM | POA: Diagnosis not present

## 2016-12-24 DIAGNOSIS — E8881 Metabolic syndrome: Secondary | ICD-10-CM | POA: Diagnosis not present

## 2016-12-24 DIAGNOSIS — E7800 Pure hypercholesterolemia, unspecified: Secondary | ICD-10-CM | POA: Diagnosis not present

## 2016-12-24 DIAGNOSIS — G4700 Insomnia, unspecified: Secondary | ICD-10-CM | POA: Diagnosis not present

## 2016-12-24 DIAGNOSIS — K219 Gastro-esophageal reflux disease without esophagitis: Secondary | ICD-10-CM | POA: Diagnosis not present

## 2017-01-20 DIAGNOSIS — K5909 Other constipation: Secondary | ICD-10-CM | POA: Diagnosis not present

## 2017-01-20 DIAGNOSIS — K219 Gastro-esophageal reflux disease without esophagitis: Secondary | ICD-10-CM | POA: Diagnosis not present

## 2017-01-20 DIAGNOSIS — J209 Acute bronchitis, unspecified: Secondary | ICD-10-CM | POA: Diagnosis not present

## 2017-01-20 DIAGNOSIS — E8881 Metabolic syndrome: Secondary | ICD-10-CM | POA: Diagnosis not present

## 2017-01-20 DIAGNOSIS — I10 Essential (primary) hypertension: Secondary | ICD-10-CM | POA: Diagnosis not present

## 2017-01-20 DIAGNOSIS — G4700 Insomnia, unspecified: Secondary | ICD-10-CM | POA: Diagnosis not present

## 2017-01-20 DIAGNOSIS — M542 Cervicalgia: Secondary | ICD-10-CM | POA: Diagnosis not present

## 2017-01-20 DIAGNOSIS — M549 Dorsalgia, unspecified: Secondary | ICD-10-CM | POA: Diagnosis not present

## 2017-01-20 DIAGNOSIS — E7800 Pure hypercholesterolemia, unspecified: Secondary | ICD-10-CM | POA: Diagnosis not present

## 2017-02-17 DIAGNOSIS — G4700 Insomnia, unspecified: Secondary | ICD-10-CM | POA: Diagnosis not present

## 2017-02-17 DIAGNOSIS — Z79891 Long term (current) use of opiate analgesic: Secondary | ICD-10-CM | POA: Diagnosis not present

## 2017-02-17 DIAGNOSIS — E7800 Pure hypercholesterolemia, unspecified: Secondary | ICD-10-CM | POA: Diagnosis not present

## 2017-02-17 DIAGNOSIS — N390 Urinary tract infection, site not specified: Secondary | ICD-10-CM | POA: Diagnosis not present

## 2017-02-17 DIAGNOSIS — K5909 Other constipation: Secondary | ICD-10-CM | POA: Diagnosis not present

## 2017-02-17 DIAGNOSIS — M549 Dorsalgia, unspecified: Secondary | ICD-10-CM | POA: Diagnosis not present

## 2017-02-17 DIAGNOSIS — I10 Essential (primary) hypertension: Secondary | ICD-10-CM | POA: Diagnosis not present

## 2017-02-17 DIAGNOSIS — F1120 Opioid dependence, uncomplicated: Secondary | ICD-10-CM | POA: Diagnosis not present

## 2017-02-17 DIAGNOSIS — K219 Gastro-esophageal reflux disease without esophagitis: Secondary | ICD-10-CM | POA: Diagnosis not present

## 2017-02-17 DIAGNOSIS — E669 Obesity, unspecified: Secondary | ICD-10-CM | POA: Diagnosis not present

## 2017-03-13 DIAGNOSIS — M542 Cervicalgia: Secondary | ICD-10-CM | POA: Diagnosis not present

## 2017-03-13 DIAGNOSIS — E7800 Pure hypercholesterolemia, unspecified: Secondary | ICD-10-CM | POA: Diagnosis not present

## 2017-03-13 DIAGNOSIS — I10 Essential (primary) hypertension: Secondary | ICD-10-CM | POA: Diagnosis not present

## 2017-03-13 DIAGNOSIS — E8881 Metabolic syndrome: Secondary | ICD-10-CM | POA: Diagnosis not present

## 2017-03-13 DIAGNOSIS — K5909 Other constipation: Secondary | ICD-10-CM | POA: Diagnosis not present

## 2017-03-13 DIAGNOSIS — F419 Anxiety disorder, unspecified: Secondary | ICD-10-CM | POA: Diagnosis not present

## 2017-03-13 DIAGNOSIS — N761 Subacute and chronic vaginitis: Secondary | ICD-10-CM | POA: Diagnosis not present

## 2017-03-13 DIAGNOSIS — K219 Gastro-esophageal reflux disease without esophagitis: Secondary | ICD-10-CM | POA: Diagnosis not present

## 2017-03-13 DIAGNOSIS — F329 Major depressive disorder, single episode, unspecified: Secondary | ICD-10-CM | POA: Diagnosis not present

## 2017-03-23 DIAGNOSIS — Z1231 Encounter for screening mammogram for malignant neoplasm of breast: Secondary | ICD-10-CM | POA: Diagnosis not present

## 2017-04-09 DIAGNOSIS — R7989 Other specified abnormal findings of blood chemistry: Secondary | ICD-10-CM | POA: Diagnosis not present

## 2017-04-09 DIAGNOSIS — I10 Essential (primary) hypertension: Secondary | ICD-10-CM | POA: Diagnosis not present

## 2017-04-09 DIAGNOSIS — M542 Cervicalgia: Secondary | ICD-10-CM | POA: Diagnosis not present

## 2017-04-09 DIAGNOSIS — R945 Abnormal results of liver function studies: Secondary | ICD-10-CM | POA: Diagnosis not present

## 2017-04-09 DIAGNOSIS — E8881 Metabolic syndrome: Secondary | ICD-10-CM | POA: Diagnosis not present

## 2017-04-09 DIAGNOSIS — F419 Anxiety disorder, unspecified: Secondary | ICD-10-CM | POA: Diagnosis not present

## 2017-04-09 DIAGNOSIS — F329 Major depressive disorder, single episode, unspecified: Secondary | ICD-10-CM | POA: Diagnosis not present

## 2017-04-09 DIAGNOSIS — E7800 Pure hypercholesterolemia, unspecified: Secondary | ICD-10-CM | POA: Diagnosis not present

## 2017-04-09 DIAGNOSIS — J209 Acute bronchitis, unspecified: Secondary | ICD-10-CM | POA: Diagnosis not present

## 2017-04-09 DIAGNOSIS — G4700 Insomnia, unspecified: Secondary | ICD-10-CM | POA: Diagnosis not present

## 2017-05-07 DIAGNOSIS — F419 Anxiety disorder, unspecified: Secondary | ICD-10-CM | POA: Diagnosis not present

## 2017-05-07 DIAGNOSIS — M542 Cervicalgia: Secondary | ICD-10-CM | POA: Diagnosis not present

## 2017-05-07 DIAGNOSIS — F329 Major depressive disorder, single episode, unspecified: Secondary | ICD-10-CM | POA: Diagnosis not present

## 2017-05-07 DIAGNOSIS — M549 Dorsalgia, unspecified: Secondary | ICD-10-CM | POA: Diagnosis not present

## 2017-05-07 DIAGNOSIS — E8881 Metabolic syndrome: Secondary | ICD-10-CM | POA: Diagnosis not present

## 2017-05-07 DIAGNOSIS — I10 Essential (primary) hypertension: Secondary | ICD-10-CM | POA: Diagnosis not present

## 2017-05-07 DIAGNOSIS — K5909 Other constipation: Secondary | ICD-10-CM | POA: Diagnosis not present

## 2017-05-07 DIAGNOSIS — K219 Gastro-esophageal reflux disease without esophagitis: Secondary | ICD-10-CM | POA: Diagnosis not present

## 2017-05-07 DIAGNOSIS — E7800 Pure hypercholesterolemia, unspecified: Secondary | ICD-10-CM | POA: Diagnosis not present

## 2017-06-04 DIAGNOSIS — I10 Essential (primary) hypertension: Secondary | ICD-10-CM | POA: Diagnosis not present

## 2017-06-04 DIAGNOSIS — K5909 Other constipation: Secondary | ICD-10-CM | POA: Diagnosis not present

## 2017-06-04 DIAGNOSIS — B373 Candidiasis of vulva and vagina: Secondary | ICD-10-CM | POA: Diagnosis not present

## 2017-06-04 DIAGNOSIS — K219 Gastro-esophageal reflux disease without esophagitis: Secondary | ICD-10-CM | POA: Diagnosis not present

## 2017-06-04 DIAGNOSIS — M542 Cervicalgia: Secondary | ICD-10-CM | POA: Diagnosis not present

## 2017-06-04 DIAGNOSIS — E7800 Pure hypercholesterolemia, unspecified: Secondary | ICD-10-CM | POA: Diagnosis not present

## 2017-06-04 DIAGNOSIS — F419 Anxiety disorder, unspecified: Secondary | ICD-10-CM | POA: Diagnosis not present

## 2017-06-04 DIAGNOSIS — E8881 Metabolic syndrome: Secondary | ICD-10-CM | POA: Diagnosis not present

## 2017-06-04 DIAGNOSIS — F329 Major depressive disorder, single episode, unspecified: Secondary | ICD-10-CM | POA: Diagnosis not present

## 2017-07-01 DIAGNOSIS — K5909 Other constipation: Secondary | ICD-10-CM | POA: Diagnosis not present

## 2017-07-01 DIAGNOSIS — F329 Major depressive disorder, single episode, unspecified: Secondary | ICD-10-CM | POA: Diagnosis not present

## 2017-07-01 DIAGNOSIS — F419 Anxiety disorder, unspecified: Secondary | ICD-10-CM | POA: Diagnosis not present

## 2017-07-01 DIAGNOSIS — K219 Gastro-esophageal reflux disease without esophagitis: Secondary | ICD-10-CM | POA: Diagnosis not present

## 2017-07-01 DIAGNOSIS — M542 Cervicalgia: Secondary | ICD-10-CM | POA: Diagnosis not present

## 2017-07-01 DIAGNOSIS — E8881 Metabolic syndrome: Secondary | ICD-10-CM | POA: Diagnosis not present

## 2017-07-01 DIAGNOSIS — J209 Acute bronchitis, unspecified: Secondary | ICD-10-CM | POA: Diagnosis not present

## 2017-07-01 DIAGNOSIS — I10 Essential (primary) hypertension: Secondary | ICD-10-CM | POA: Diagnosis not present

## 2017-07-01 DIAGNOSIS — E7800 Pure hypercholesterolemia, unspecified: Secondary | ICD-10-CM | POA: Diagnosis not present

## 2017-07-02 DIAGNOSIS — N904 Leukoplakia of vulva: Secondary | ICD-10-CM | POA: Diagnosis not present

## 2017-07-09 DIAGNOSIS — M47816 Spondylosis without myelopathy or radiculopathy, lumbar region: Secondary | ICD-10-CM | POA: Diagnosis not present

## 2017-07-09 DIAGNOSIS — M545 Low back pain: Secondary | ICD-10-CM | POA: Diagnosis not present

## 2017-07-09 DIAGNOSIS — M5117 Intervertebral disc disorders with radiculopathy, lumbosacral region: Secondary | ICD-10-CM | POA: Diagnosis not present

## 2017-07-09 DIAGNOSIS — M5136 Other intervertebral disc degeneration, lumbar region: Secondary | ICD-10-CM | POA: Diagnosis not present

## 2017-07-09 DIAGNOSIS — M5432 Sciatica, left side: Secondary | ICD-10-CM | POA: Diagnosis not present

## 2017-07-29 DIAGNOSIS — K5909 Other constipation: Secondary | ICD-10-CM | POA: Diagnosis not present

## 2017-07-29 DIAGNOSIS — F329 Major depressive disorder, single episode, unspecified: Secondary | ICD-10-CM | POA: Diagnosis not present

## 2017-07-29 DIAGNOSIS — I10 Essential (primary) hypertension: Secondary | ICD-10-CM | POA: Diagnosis not present

## 2017-07-29 DIAGNOSIS — E7800 Pure hypercholesterolemia, unspecified: Secondary | ICD-10-CM | POA: Diagnosis not present

## 2017-07-29 DIAGNOSIS — M542 Cervicalgia: Secondary | ICD-10-CM | POA: Diagnosis not present

## 2017-07-29 DIAGNOSIS — E8881 Metabolic syndrome: Secondary | ICD-10-CM | POA: Diagnosis not present

## 2017-07-29 DIAGNOSIS — M25562 Pain in left knee: Secondary | ICD-10-CM | POA: Diagnosis not present

## 2017-07-29 DIAGNOSIS — K219 Gastro-esophageal reflux disease without esophagitis: Secondary | ICD-10-CM | POA: Diagnosis not present

## 2017-07-29 DIAGNOSIS — F419 Anxiety disorder, unspecified: Secondary | ICD-10-CM | POA: Diagnosis not present

## 2017-08-06 DIAGNOSIS — M542 Cervicalgia: Secondary | ICD-10-CM | POA: Diagnosis not present

## 2017-08-06 DIAGNOSIS — K219 Gastro-esophageal reflux disease without esophagitis: Secondary | ICD-10-CM | POA: Diagnosis not present

## 2017-08-06 DIAGNOSIS — E8881 Metabolic syndrome: Secondary | ICD-10-CM | POA: Diagnosis not present

## 2017-08-06 DIAGNOSIS — I10 Essential (primary) hypertension: Secondary | ICD-10-CM | POA: Diagnosis not present

## 2017-08-06 DIAGNOSIS — F329 Major depressive disorder, single episode, unspecified: Secondary | ICD-10-CM | POA: Diagnosis not present

## 2017-08-06 DIAGNOSIS — F419 Anxiety disorder, unspecified: Secondary | ICD-10-CM | POA: Diagnosis not present

## 2017-08-06 DIAGNOSIS — E7800 Pure hypercholesterolemia, unspecified: Secondary | ICD-10-CM | POA: Diagnosis not present

## 2017-08-06 DIAGNOSIS — M25562 Pain in left knee: Secondary | ICD-10-CM | POA: Diagnosis not present

## 2017-08-06 DIAGNOSIS — J208 Acute bronchitis due to other specified organisms: Secondary | ICD-10-CM | POA: Diagnosis not present

## 2017-08-13 DIAGNOSIS — M5416 Radiculopathy, lumbar region: Secondary | ICD-10-CM | POA: Diagnosis not present

## 2017-08-13 DIAGNOSIS — M1712 Unilateral primary osteoarthritis, left knee: Secondary | ICD-10-CM | POA: Diagnosis not present

## 2017-08-19 DIAGNOSIS — M545 Low back pain: Secondary | ICD-10-CM | POA: Diagnosis not present

## 2017-08-19 DIAGNOSIS — M5416 Radiculopathy, lumbar region: Secondary | ICD-10-CM | POA: Diagnosis not present

## 2017-08-21 DIAGNOSIS — M1712 Unilateral primary osteoarthritis, left knee: Secondary | ICD-10-CM | POA: Diagnosis not present

## 2017-08-21 DIAGNOSIS — M5416 Radiculopathy, lumbar region: Secondary | ICD-10-CM | POA: Diagnosis not present

## 2017-08-26 DIAGNOSIS — F419 Anxiety disorder, unspecified: Secondary | ICD-10-CM | POA: Diagnosis not present

## 2017-08-26 DIAGNOSIS — I10 Essential (primary) hypertension: Secondary | ICD-10-CM | POA: Diagnosis not present

## 2017-08-26 DIAGNOSIS — E7800 Pure hypercholesterolemia, unspecified: Secondary | ICD-10-CM | POA: Diagnosis not present

## 2017-08-26 DIAGNOSIS — E8881 Metabolic syndrome: Secondary | ICD-10-CM | POA: Diagnosis not present

## 2017-08-26 DIAGNOSIS — J219 Acute bronchiolitis, unspecified: Secondary | ICD-10-CM | POA: Diagnosis not present

## 2017-08-26 DIAGNOSIS — K5909 Other constipation: Secondary | ICD-10-CM | POA: Diagnosis not present

## 2017-08-26 DIAGNOSIS — F329 Major depressive disorder, single episode, unspecified: Secondary | ICD-10-CM | POA: Diagnosis not present

## 2017-08-26 DIAGNOSIS — M549 Dorsalgia, unspecified: Secondary | ICD-10-CM | POA: Diagnosis not present

## 2017-08-26 DIAGNOSIS — M542 Cervicalgia: Secondary | ICD-10-CM | POA: Diagnosis not present

## 2017-09-22 DIAGNOSIS — Z9181 History of falling: Secondary | ICD-10-CM | POA: Diagnosis not present

## 2017-09-22 DIAGNOSIS — E8881 Metabolic syndrome: Secondary | ICD-10-CM | POA: Diagnosis not present

## 2017-09-22 DIAGNOSIS — I10 Essential (primary) hypertension: Secondary | ICD-10-CM | POA: Diagnosis not present

## 2017-09-22 DIAGNOSIS — K5909 Other constipation: Secondary | ICD-10-CM | POA: Diagnosis not present

## 2017-09-22 DIAGNOSIS — F329 Major depressive disorder, single episode, unspecified: Secondary | ICD-10-CM | POA: Diagnosis not present

## 2017-09-22 DIAGNOSIS — E7800 Pure hypercholesterolemia, unspecified: Secondary | ICD-10-CM | POA: Diagnosis not present

## 2017-09-22 DIAGNOSIS — M542 Cervicalgia: Secondary | ICD-10-CM | POA: Diagnosis not present

## 2017-09-22 DIAGNOSIS — M5416 Radiculopathy, lumbar region: Secondary | ICD-10-CM | POA: Diagnosis not present

## 2017-09-22 DIAGNOSIS — F419 Anxiety disorder, unspecified: Secondary | ICD-10-CM | POA: Diagnosis not present

## 2017-09-22 DIAGNOSIS — K219 Gastro-esophageal reflux disease without esophagitis: Secondary | ICD-10-CM | POA: Diagnosis not present

## 2017-10-20 DIAGNOSIS — K5909 Other constipation: Secondary | ICD-10-CM | POA: Diagnosis not present

## 2017-10-20 DIAGNOSIS — E7800 Pure hypercholesterolemia, unspecified: Secondary | ICD-10-CM | POA: Diagnosis not present

## 2017-10-20 DIAGNOSIS — M542 Cervicalgia: Secondary | ICD-10-CM | POA: Diagnosis not present

## 2017-10-20 DIAGNOSIS — M549 Dorsalgia, unspecified: Secondary | ICD-10-CM | POA: Diagnosis not present

## 2017-10-20 DIAGNOSIS — E8881 Metabolic syndrome: Secondary | ICD-10-CM | POA: Diagnosis not present

## 2017-10-20 DIAGNOSIS — Z23 Encounter for immunization: Secondary | ICD-10-CM | POA: Diagnosis not present

## 2017-10-20 DIAGNOSIS — F419 Anxiety disorder, unspecified: Secondary | ICD-10-CM | POA: Diagnosis not present

## 2017-10-20 DIAGNOSIS — K219 Gastro-esophageal reflux disease without esophagitis: Secondary | ICD-10-CM | POA: Diagnosis not present

## 2017-10-20 DIAGNOSIS — I10 Essential (primary) hypertension: Secondary | ICD-10-CM | POA: Diagnosis not present

## 2017-11-18 DIAGNOSIS — M5416 Radiculopathy, lumbar region: Secondary | ICD-10-CM | POA: Diagnosis not present

## 2017-11-18 DIAGNOSIS — G8929 Other chronic pain: Secondary | ICD-10-CM | POA: Diagnosis not present

## 2017-11-18 DIAGNOSIS — Z79899 Other long term (current) drug therapy: Secondary | ICD-10-CM | POA: Diagnosis not present

## 2017-11-18 DIAGNOSIS — F1120 Opioid dependence, uncomplicated: Secondary | ICD-10-CM | POA: Diagnosis not present

## 2017-11-18 DIAGNOSIS — M545 Low back pain: Secondary | ICD-10-CM | POA: Diagnosis not present

## 2017-11-18 DIAGNOSIS — M50122 Cervical disc disorder at C5-C6 level with radiculopathy: Secondary | ICD-10-CM | POA: Diagnosis not present

## 2017-11-18 DIAGNOSIS — M5432 Sciatica, left side: Secondary | ICD-10-CM | POA: Diagnosis not present

## 2017-11-18 DIAGNOSIS — G894 Chronic pain syndrome: Secondary | ICD-10-CM | POA: Diagnosis not present

## 2017-11-18 DIAGNOSIS — M542 Cervicalgia: Secondary | ICD-10-CM | POA: Diagnosis not present

## 2017-11-19 DIAGNOSIS — E8881 Metabolic syndrome: Secondary | ICD-10-CM | POA: Diagnosis not present

## 2017-11-19 DIAGNOSIS — K5909 Other constipation: Secondary | ICD-10-CM | POA: Diagnosis not present

## 2017-11-19 DIAGNOSIS — I10 Essential (primary) hypertension: Secondary | ICD-10-CM | POA: Diagnosis not present

## 2017-11-19 DIAGNOSIS — E7800 Pure hypercholesterolemia, unspecified: Secondary | ICD-10-CM | POA: Diagnosis not present

## 2017-11-19 DIAGNOSIS — M549 Dorsalgia, unspecified: Secondary | ICD-10-CM | POA: Diagnosis not present

## 2017-11-19 DIAGNOSIS — F419 Anxiety disorder, unspecified: Secondary | ICD-10-CM | POA: Diagnosis not present

## 2017-11-19 DIAGNOSIS — K219 Gastro-esophageal reflux disease without esophagitis: Secondary | ICD-10-CM | POA: Diagnosis not present

## 2017-11-19 DIAGNOSIS — R945 Abnormal results of liver function studies: Secondary | ICD-10-CM | POA: Diagnosis not present

## 2017-11-19 DIAGNOSIS — M542 Cervicalgia: Secondary | ICD-10-CM | POA: Diagnosis not present

## 2017-12-17 DIAGNOSIS — K219 Gastro-esophageal reflux disease without esophagitis: Secondary | ICD-10-CM | POA: Diagnosis not present

## 2017-12-17 DIAGNOSIS — M542 Cervicalgia: Secondary | ICD-10-CM | POA: Diagnosis not present

## 2017-12-17 DIAGNOSIS — F419 Anxiety disorder, unspecified: Secondary | ICD-10-CM | POA: Diagnosis not present

## 2017-12-17 DIAGNOSIS — I10 Essential (primary) hypertension: Secondary | ICD-10-CM | POA: Diagnosis not present

## 2017-12-17 DIAGNOSIS — Z1331 Encounter for screening for depression: Secondary | ICD-10-CM | POA: Diagnosis not present

## 2017-12-17 DIAGNOSIS — K5909 Other constipation: Secondary | ICD-10-CM | POA: Diagnosis not present

## 2017-12-17 DIAGNOSIS — E7800 Pure hypercholesterolemia, unspecified: Secondary | ICD-10-CM | POA: Diagnosis not present

## 2017-12-17 DIAGNOSIS — M549 Dorsalgia, unspecified: Secondary | ICD-10-CM | POA: Diagnosis not present

## 2017-12-17 DIAGNOSIS — E8881 Metabolic syndrome: Secondary | ICD-10-CM | POA: Diagnosis not present

## 2018-01-14 DIAGNOSIS — J069 Acute upper respiratory infection, unspecified: Secondary | ICD-10-CM | POA: Diagnosis not present

## 2018-01-14 DIAGNOSIS — I10 Essential (primary) hypertension: Secondary | ICD-10-CM | POA: Diagnosis not present

## 2018-01-14 DIAGNOSIS — M542 Cervicalgia: Secondary | ICD-10-CM | POA: Diagnosis not present

## 2018-01-14 DIAGNOSIS — F419 Anxiety disorder, unspecified: Secondary | ICD-10-CM | POA: Diagnosis not present

## 2018-01-14 DIAGNOSIS — M549 Dorsalgia, unspecified: Secondary | ICD-10-CM | POA: Diagnosis not present

## 2018-01-14 DIAGNOSIS — E8881 Metabolic syndrome: Secondary | ICD-10-CM | POA: Diagnosis not present

## 2018-01-14 DIAGNOSIS — E7800 Pure hypercholesterolemia, unspecified: Secondary | ICD-10-CM | POA: Diagnosis not present

## 2018-01-14 DIAGNOSIS — K5909 Other constipation: Secondary | ICD-10-CM | POA: Diagnosis not present

## 2018-01-14 DIAGNOSIS — K219 Gastro-esophageal reflux disease without esophagitis: Secondary | ICD-10-CM | POA: Diagnosis not present

## 2018-01-25 DIAGNOSIS — Z1212 Encounter for screening for malignant neoplasm of rectum: Secondary | ICD-10-CM | POA: Diagnosis not present

## 2018-01-25 DIAGNOSIS — Z1211 Encounter for screening for malignant neoplasm of colon: Secondary | ICD-10-CM | POA: Diagnosis not present

## 2018-02-12 DIAGNOSIS — K219 Gastro-esophageal reflux disease without esophagitis: Secondary | ICD-10-CM | POA: Diagnosis not present

## 2018-02-12 DIAGNOSIS — F419 Anxiety disorder, unspecified: Secondary | ICD-10-CM | POA: Diagnosis not present

## 2018-02-12 DIAGNOSIS — M542 Cervicalgia: Secondary | ICD-10-CM | POA: Diagnosis not present

## 2018-02-12 DIAGNOSIS — E8881 Metabolic syndrome: Secondary | ICD-10-CM | POA: Diagnosis not present

## 2018-02-12 DIAGNOSIS — K5909 Other constipation: Secondary | ICD-10-CM | POA: Diagnosis not present

## 2018-02-12 DIAGNOSIS — E7800 Pure hypercholesterolemia, unspecified: Secondary | ICD-10-CM | POA: Diagnosis not present

## 2018-02-12 DIAGNOSIS — I10 Essential (primary) hypertension: Secondary | ICD-10-CM | POA: Diagnosis not present

## 2018-02-12 DIAGNOSIS — R945 Abnormal results of liver function studies: Secondary | ICD-10-CM | POA: Diagnosis not present

## 2018-02-12 DIAGNOSIS — M549 Dorsalgia, unspecified: Secondary | ICD-10-CM | POA: Diagnosis not present

## 2018-03-11 DIAGNOSIS — J208 Acute bronchitis due to other specified organisms: Secondary | ICD-10-CM | POA: Diagnosis not present

## 2018-03-11 DIAGNOSIS — F329 Major depressive disorder, single episode, unspecified: Secondary | ICD-10-CM | POA: Diagnosis not present

## 2018-03-11 DIAGNOSIS — K219 Gastro-esophageal reflux disease without esophagitis: Secondary | ICD-10-CM | POA: Diagnosis not present

## 2018-03-11 DIAGNOSIS — I10 Essential (primary) hypertension: Secondary | ICD-10-CM | POA: Diagnosis not present

## 2018-03-11 DIAGNOSIS — E8881 Metabolic syndrome: Secondary | ICD-10-CM | POA: Diagnosis not present

## 2018-03-11 DIAGNOSIS — F419 Anxiety disorder, unspecified: Secondary | ICD-10-CM | POA: Diagnosis not present

## 2018-03-11 DIAGNOSIS — K5909 Other constipation: Secondary | ICD-10-CM | POA: Diagnosis not present

## 2018-03-11 DIAGNOSIS — M542 Cervicalgia: Secondary | ICD-10-CM | POA: Diagnosis not present

## 2018-03-11 DIAGNOSIS — E7800 Pure hypercholesterolemia, unspecified: Secondary | ICD-10-CM | POA: Diagnosis not present

## 2018-04-08 DIAGNOSIS — M542 Cervicalgia: Secondary | ICD-10-CM | POA: Diagnosis not present

## 2018-04-08 DIAGNOSIS — E7800 Pure hypercholesterolemia, unspecified: Secondary | ICD-10-CM | POA: Diagnosis not present

## 2018-04-08 DIAGNOSIS — R300 Dysuria: Secondary | ICD-10-CM | POA: Diagnosis not present

## 2018-04-08 DIAGNOSIS — I10 Essential (primary) hypertension: Secondary | ICD-10-CM | POA: Diagnosis not present

## 2018-04-08 DIAGNOSIS — K5909 Other constipation: Secondary | ICD-10-CM | POA: Diagnosis not present

## 2018-04-08 DIAGNOSIS — E8881 Metabolic syndrome: Secondary | ICD-10-CM | POA: Diagnosis not present

## 2018-04-08 DIAGNOSIS — M549 Dorsalgia, unspecified: Secondary | ICD-10-CM | POA: Diagnosis not present

## 2018-04-08 DIAGNOSIS — F419 Anxiety disorder, unspecified: Secondary | ICD-10-CM | POA: Diagnosis not present

## 2018-04-08 DIAGNOSIS — K219 Gastro-esophageal reflux disease without esophagitis: Secondary | ICD-10-CM | POA: Diagnosis not present

## 2018-05-06 DIAGNOSIS — I10 Essential (primary) hypertension: Secondary | ICD-10-CM | POA: Diagnosis not present

## 2018-05-06 DIAGNOSIS — J208 Acute bronchitis due to other specified organisms: Secondary | ICD-10-CM | POA: Diagnosis not present

## 2018-05-06 DIAGNOSIS — F419 Anxiety disorder, unspecified: Secondary | ICD-10-CM | POA: Diagnosis not present

## 2018-05-06 DIAGNOSIS — M542 Cervicalgia: Secondary | ICD-10-CM | POA: Diagnosis not present

## 2018-05-06 DIAGNOSIS — M549 Dorsalgia, unspecified: Secondary | ICD-10-CM | POA: Diagnosis not present

## 2018-05-06 DIAGNOSIS — E7800 Pure hypercholesterolemia, unspecified: Secondary | ICD-10-CM | POA: Diagnosis not present

## 2018-05-06 DIAGNOSIS — K5909 Other constipation: Secondary | ICD-10-CM | POA: Diagnosis not present

## 2018-05-06 DIAGNOSIS — K219 Gastro-esophageal reflux disease without esophagitis: Secondary | ICD-10-CM | POA: Diagnosis not present

## 2018-05-06 DIAGNOSIS — E8881 Metabolic syndrome: Secondary | ICD-10-CM | POA: Diagnosis not present

## 2018-05-20 DIAGNOSIS — M5117 Intervertebral disc disorders with radiculopathy, lumbosacral region: Secondary | ICD-10-CM | POA: Diagnosis not present

## 2018-05-20 DIAGNOSIS — M5386 Other specified dorsopathies, lumbar region: Secondary | ICD-10-CM | POA: Diagnosis not present

## 2018-05-20 DIAGNOSIS — M5442 Lumbago with sciatica, left side: Secondary | ICD-10-CM | POA: Diagnosis not present

## 2018-05-20 DIAGNOSIS — M545 Low back pain: Secondary | ICD-10-CM | POA: Diagnosis not present

## 2018-05-20 DIAGNOSIS — G8929 Other chronic pain: Secondary | ICD-10-CM | POA: Diagnosis not present

## 2018-05-29 DIAGNOSIS — M5441 Lumbago with sciatica, right side: Secondary | ICD-10-CM | POA: Diagnosis not present

## 2018-05-29 DIAGNOSIS — M5432 Sciatica, left side: Secondary | ICD-10-CM | POA: Diagnosis not present

## 2018-05-29 DIAGNOSIS — M5431 Sciatica, right side: Secondary | ICD-10-CM | POA: Diagnosis not present

## 2018-05-29 DIAGNOSIS — M545 Low back pain: Secondary | ICD-10-CM | POA: Diagnosis not present

## 2018-05-29 DIAGNOSIS — M5416 Radiculopathy, lumbar region: Secondary | ICD-10-CM | POA: Diagnosis not present

## 2018-05-29 DIAGNOSIS — M5442 Lumbago with sciatica, left side: Secondary | ICD-10-CM | POA: Diagnosis not present

## 2018-06-03 DIAGNOSIS — K219 Gastro-esophageal reflux disease without esophagitis: Secondary | ICD-10-CM | POA: Diagnosis not present

## 2018-06-03 DIAGNOSIS — I10 Essential (primary) hypertension: Secondary | ICD-10-CM | POA: Diagnosis not present

## 2018-06-03 DIAGNOSIS — R945 Abnormal results of liver function studies: Secondary | ICD-10-CM | POA: Diagnosis not present

## 2018-06-03 DIAGNOSIS — F419 Anxiety disorder, unspecified: Secondary | ICD-10-CM | POA: Diagnosis not present

## 2018-06-03 DIAGNOSIS — K5909 Other constipation: Secondary | ICD-10-CM | POA: Diagnosis not present

## 2018-06-03 DIAGNOSIS — E8881 Metabolic syndrome: Secondary | ICD-10-CM | POA: Diagnosis not present

## 2018-06-03 DIAGNOSIS — E7800 Pure hypercholesterolemia, unspecified: Secondary | ICD-10-CM | POA: Diagnosis not present

## 2018-06-03 DIAGNOSIS — M542 Cervicalgia: Secondary | ICD-10-CM | POA: Diagnosis not present

## 2018-06-03 DIAGNOSIS — M549 Dorsalgia, unspecified: Secondary | ICD-10-CM | POA: Diagnosis not present

## 2018-06-10 DIAGNOSIS — R945 Abnormal results of liver function studies: Secondary | ICD-10-CM | POA: Diagnosis not present

## 2018-06-10 DIAGNOSIS — E7800 Pure hypercholesterolemia, unspecified: Secondary | ICD-10-CM | POA: Diagnosis not present

## 2018-06-10 DIAGNOSIS — K5909 Other constipation: Secondary | ICD-10-CM | POA: Diagnosis not present

## 2018-06-10 DIAGNOSIS — I10 Essential (primary) hypertension: Secondary | ICD-10-CM | POA: Diagnosis not present

## 2018-06-10 DIAGNOSIS — M549 Dorsalgia, unspecified: Secondary | ICD-10-CM | POA: Diagnosis not present

## 2018-06-10 DIAGNOSIS — K219 Gastro-esophageal reflux disease without esophagitis: Secondary | ICD-10-CM | POA: Diagnosis not present

## 2018-06-10 DIAGNOSIS — M542 Cervicalgia: Secondary | ICD-10-CM | POA: Diagnosis not present

## 2018-06-10 DIAGNOSIS — E8881 Metabolic syndrome: Secondary | ICD-10-CM | POA: Diagnosis not present

## 2018-06-10 DIAGNOSIS — F419 Anxiety disorder, unspecified: Secondary | ICD-10-CM | POA: Diagnosis not present

## 2018-06-14 DIAGNOSIS — M542 Cervicalgia: Secondary | ICD-10-CM | POA: Diagnosis not present

## 2018-06-14 DIAGNOSIS — K5909 Other constipation: Secondary | ICD-10-CM | POA: Diagnosis not present

## 2018-06-14 DIAGNOSIS — M549 Dorsalgia, unspecified: Secondary | ICD-10-CM | POA: Diagnosis not present

## 2018-06-14 DIAGNOSIS — F419 Anxiety disorder, unspecified: Secondary | ICD-10-CM | POA: Diagnosis not present

## 2018-06-14 DIAGNOSIS — E8881 Metabolic syndrome: Secondary | ICD-10-CM | POA: Diagnosis not present

## 2018-06-14 DIAGNOSIS — K219 Gastro-esophageal reflux disease without esophagitis: Secondary | ICD-10-CM | POA: Diagnosis not present

## 2018-06-14 DIAGNOSIS — R945 Abnormal results of liver function studies: Secondary | ICD-10-CM | POA: Diagnosis not present

## 2018-06-14 DIAGNOSIS — E7800 Pure hypercholesterolemia, unspecified: Secondary | ICD-10-CM | POA: Diagnosis not present

## 2018-06-14 DIAGNOSIS — I10 Essential (primary) hypertension: Secondary | ICD-10-CM | POA: Diagnosis not present

## 2018-06-17 ENCOUNTER — Observation Stay (HOSPITAL_COMMUNITY)
Admission: EM | Admit: 2018-06-17 | Discharge: 2018-06-18 | Disposition: A | Discharge status: Home / Self Care | Payer: Medicare HMO | Attending: Internal Medicine | Admitting: Internal Medicine

## 2018-06-17 ENCOUNTER — Other Ambulatory Visit: Payer: Self-pay

## 2018-06-17 ENCOUNTER — Encounter (HOSPITAL_COMMUNITY): Payer: Self-pay | Admitting: Emergency Medicine

## 2018-06-17 ENCOUNTER — Observation Stay (HOSPITAL_COMMUNITY): Payer: Medicare HMO

## 2018-06-17 DIAGNOSIS — R2681 Unsteadiness on feet: Secondary | ICD-10-CM | POA: Diagnosis present

## 2018-06-17 DIAGNOSIS — I2510 Atherosclerotic heart disease of native coronary artery without angina pectoris: Secondary | ICD-10-CM | POA: Diagnosis present

## 2018-06-17 DIAGNOSIS — E66812 Obesity, class 2: Secondary | ICD-10-CM | POA: Diagnosis present

## 2018-06-17 DIAGNOSIS — M545 Low back pain, unspecified: Secondary | ICD-10-CM | POA: Diagnosis present

## 2018-06-17 DIAGNOSIS — M5417 Radiculopathy, lumbosacral region: Secondary | ICD-10-CM | POA: Diagnosis not present

## 2018-06-17 DIAGNOSIS — M549 Dorsalgia, unspecified: Secondary | ICD-10-CM | POA: Diagnosis not present

## 2018-06-17 DIAGNOSIS — M5416 Radiculopathy, lumbar region: Secondary | ICD-10-CM

## 2018-06-17 DIAGNOSIS — F419 Anxiety disorder, unspecified: Secondary | ICD-10-CM | POA: Diagnosis present

## 2018-06-17 DIAGNOSIS — M48061 Spinal stenosis, lumbar region without neurogenic claudication: Secondary | ICD-10-CM

## 2018-06-17 DIAGNOSIS — I10 Essential (primary) hypertension: Secondary | ICD-10-CM | POA: Diagnosis present

## 2018-06-17 DIAGNOSIS — G8929 Other chronic pain: Secondary | ICD-10-CM | POA: Diagnosis present

## 2018-06-17 DIAGNOSIS — M79606 Pain in leg, unspecified: Secondary | ICD-10-CM | POA: Diagnosis present

## 2018-06-17 DIAGNOSIS — E669 Obesity, unspecified: Secondary | ICD-10-CM | POA: Diagnosis present

## 2018-06-17 DIAGNOSIS — M79604 Pain in right leg: Secondary | ICD-10-CM | POA: Diagnosis present

## 2018-06-17 DIAGNOSIS — F32A Depression, unspecified: Secondary | ICD-10-CM | POA: Diagnosis present

## 2018-06-17 DIAGNOSIS — F329 Major depressive disorder, single episode, unspecified: Secondary | ICD-10-CM | POA: Diagnosis present

## 2018-06-17 DIAGNOSIS — E7800 Pure hypercholesterolemia, unspecified: Secondary | ICD-10-CM | POA: Diagnosis present

## 2018-06-17 DIAGNOSIS — Z79899 Other long term (current) drug therapy: Secondary | ICD-10-CM | POA: Diagnosis not present

## 2018-06-17 DIAGNOSIS — R05 Cough: Secondary | ICD-10-CM | POA: Diagnosis not present

## 2018-06-17 HISTORY — DX: Obesity, class 2: E66.812

## 2018-06-17 HISTORY — DX: Obesity, unspecified: E66.9

## 2018-06-17 HISTORY — DX: Gastro-esophageal reflux disease without esophagitis: K21.9

## 2018-06-17 HISTORY — DX: Low back pain, unspecified: M54.50

## 2018-06-17 HISTORY — DX: Pain in leg, unspecified: M79.606

## 2018-06-17 HISTORY — DX: Malignant melanoma of skin, unspecified: C43.9

## 2018-06-17 HISTORY — DX: Dorsalgia, unspecified: M54.9

## 2018-06-17 HISTORY — DX: Unspecified osteoarthritis, unspecified site: M19.90

## 2018-06-17 HISTORY — DX: Pain in left hip: M25.552

## 2018-06-17 HISTORY — DX: Atherosclerotic heart disease of native coronary artery without angina pectoris: I25.10

## 2018-06-17 HISTORY — DX: Low back pain: M54.5

## 2018-06-17 HISTORY — DX: Other chronic pain: G89.29

## 2018-06-17 HISTORY — DX: Unsteadiness on feet: R26.81

## 2018-06-17 LAB — CBC WITH DIFFERENTIAL/PLATELET
Abs Immature Granulocytes: 0.1 10*3/uL (ref 0.0–0.1)
Basophils Absolute: 0.1 10*3/uL (ref 0.0–0.1)
Basophils Relative: 1 %
EOS ABS: 0.5 10*3/uL (ref 0.0–0.7)
EOS PCT: 6 %
HEMATOCRIT: 36.2 % (ref 36.0–46.0)
HEMOGLOBIN: 11.3 g/dL — AB (ref 12.0–15.0)
Immature Granulocytes: 1 %
LYMPHS ABS: 2.1 10*3/uL (ref 0.7–4.0)
LYMPHS PCT: 23 %
MCH: 29 pg (ref 26.0–34.0)
MCHC: 31.2 g/dL (ref 30.0–36.0)
MCV: 93.1 fL (ref 78.0–100.0)
MONO ABS: 0.6 10*3/uL (ref 0.1–1.0)
MONOS PCT: 7 %
Neutro Abs: 5.7 10*3/uL (ref 1.7–7.7)
Neutrophils Relative %: 62 %
Platelets: 275 10*3/uL (ref 150–400)
RBC: 3.89 MIL/uL (ref 3.87–5.11)
RDW: 14.8 % (ref 11.5–15.5)
WBC: 9 10*3/uL (ref 4.0–10.5)

## 2018-06-17 LAB — BASIC METABOLIC PANEL
Anion gap: 8 (ref 5–15)
BUN: 13 mg/dL (ref 8–23)
CHLORIDE: 106 mmol/L (ref 98–111)
CO2: 23 mmol/L (ref 22–32)
CREATININE: 0.83 mg/dL (ref 0.44–1.00)
Calcium: 8.4 mg/dL — ABNORMAL LOW (ref 8.9–10.3)
GFR calc Af Amer: >60 mL/min (ref >60)
GFR calc non Af Amer: >60 mL/min (ref >60)
GLUCOSE: 114 mg/dL — AB (ref 70–99)
POTASSIUM: 4.5 mmol/L (ref 3.5–5.1)
SODIUM: 137 mmol/L (ref 135–145)

## 2018-06-17 LAB — HIV ANTIBODY (ROUTINE TESTING W REFLEX): HIV SCREEN 4TH GENERATION: NONREACTIVE

## 2018-06-17 MED ORDER — ENOXAPARIN SODIUM 40 MG/0.4ML ~~LOC~~ SOLN
40.0000 mg | SUBCUTANEOUS | Status: DC
  Administered 2018-06-17 – 2018-06-18 (×2): 40 mg via SUBCUTANEOUS
  Filled 2018-06-17 (×2): qty 0.4

## 2018-06-17 MED ORDER — KETOROLAC TROMETHAMINE 15 MG/ML IJ SOLN
15.0000 mg | Freq: Four times a day (QID) | INTRAMUSCULAR | Status: DC
  Administered 2018-06-17 – 2018-06-18 (×5): 15 mg via INTRAVENOUS
  Filled 2018-06-17 (×5): qty 1

## 2018-06-17 MED ORDER — ONDANSETRON HCL 4 MG PO TABS: 4.0000 mg | ORAL_TABLET | Freq: Four times a day (QID) | ORAL | Status: DC | PRN

## 2018-06-17 MED ORDER — BENZONATATE 100 MG PO CAPS
200.0000 mg | ORAL_CAPSULE | Freq: Three times a day (TID) | ORAL | Status: DC
  Administered 2018-06-17 – 2018-06-18 (×3): 200 mg via ORAL
  Filled 2018-06-17 (×3): qty 2

## 2018-06-17 MED ORDER — ZOLPIDEM TARTRATE ER 12.5 MG PO TBCR: 12.5000 mg | EXTENDED_RELEASE_TABLET | Freq: Every day | ORAL | Status: DC

## 2018-06-17 MED ORDER — ATORVASTATIN CALCIUM 80 MG PO TABS
80.0000 mg | ORAL_TABLET | Freq: Every day | ORAL | Status: DC
  Administered 2018-06-17 – 2018-06-18 (×2): 80 mg via ORAL
  Filled 2018-06-17 (×2): qty 1

## 2018-06-17 MED ORDER — SODIUM CHLORIDE 0.9% FLUSH
3.0000 mL | Freq: Two times a day (BID) | INTRAVENOUS | Status: DC
  Administered 2018-06-17 – 2018-06-18 (×3): 3 mL via INTRAVENOUS

## 2018-06-17 MED ORDER — ACETAMINOPHEN 650 MG RE SUPP: 650.0000 mg | Freq: Four times a day (QID) | RECTAL | Status: DC | PRN

## 2018-06-17 MED ORDER — HYDROMORPHONE HCL 1 MG/ML IJ SOLN
1.0000 mg | Freq: Once | INTRAMUSCULAR | Status: AC
  Administered 2018-06-17: 1 mg via INTRAVENOUS
  Filled 2018-06-17: qty 1

## 2018-06-17 MED ORDER — OXYCODONE HCL 5 MG PO TABS
5.0000 mg | ORAL_TABLET | Freq: Four times a day (QID) | ORAL | Status: DC | PRN
  Administered 2018-06-17 – 2018-06-18 (×2): 5 mg via ORAL
  Filled 2018-06-17 (×2): qty 1

## 2018-06-17 MED ORDER — TRAMADOL HCL 50 MG PO TABS
50.0000 mg | ORAL_TABLET | Freq: Four times a day (QID) | ORAL | Status: DC | PRN
  Administered 2018-06-17 (×2): 50 mg via ORAL
  Filled 2018-06-17 (×3): qty 1

## 2018-06-17 MED ORDER — ACETAMINOPHEN 325 MG PO TABS
650.0000 mg | ORAL_TABLET | Freq: Four times a day (QID) | ORAL | Status: DC | PRN
  Administered 2018-06-18: 650 mg via ORAL
  Filled 2018-06-17: qty 2

## 2018-06-17 MED ORDER — LIDOCAINE 5 % EX PTCH
1.0000 | MEDICATED_PATCH | CUTANEOUS | Status: DC
  Administered 2018-06-17 – 2018-06-18 (×2): 1 via TRANSDERMAL
  Filled 2018-06-17 (×2): qty 1

## 2018-06-17 MED ORDER — BENZONATATE 100 MG PO CAPS
100.0000 mg | ORAL_CAPSULE | Freq: Once | ORAL | Status: AC
  Administered 2018-06-17: 100 mg via ORAL
  Filled 2018-06-17: qty 1

## 2018-06-17 MED ORDER — CYCLOBENZAPRINE HCL 10 MG PO TABS
5.0000 mg | ORAL_TABLET | Freq: Four times a day (QID) | ORAL | Status: DC
  Administered 2018-06-17 – 2018-06-18 (×5): 5 mg via ORAL
  Filled 2018-06-17 (×5): qty 1

## 2018-06-17 MED ORDER — HYDROCODONE-HOMATROPINE 5-1.5 MG/5ML PO SYRP
5.0000 mL | ORAL_SOLUTION | Freq: Four times a day (QID) | ORAL | Status: DC | PRN
  Administered 2018-06-17 – 2018-06-18 (×3): 5 mL via ORAL
  Filled 2018-06-17 (×3): qty 5

## 2018-06-17 MED ORDER — ONDANSETRON HCL 4 MG/2ML IJ SOLN: 4.0000 mg | Freq: Four times a day (QID) | INTRAMUSCULAR | Status: DC | PRN

## 2018-06-17 MED ORDER — GABAPENTIN 600 MG PO TABS
600.0000 mg | ORAL_TABLET | Freq: Three times a day (TID) | ORAL | Status: DC | PRN
  Administered 2018-06-18: 600 mg via ORAL
  Filled 2018-06-17 (×2): qty 1

## 2018-06-17 MED ORDER — FAMOTIDINE 20 MG PO TABS
20.0000 mg | ORAL_TABLET | Freq: Two times a day (BID) | ORAL | Status: DC
  Administered 2018-06-17 – 2018-06-18 (×3): 20 mg via ORAL
  Filled 2018-06-17 (×3): qty 1

## 2018-06-17 MED ORDER — ZOLPIDEM TARTRATE 5 MG PO TABS
5.0000 mg | ORAL_TABLET | Freq: Every evening | ORAL | Status: DC | PRN
  Administered 2018-06-17: 5 mg via ORAL
  Filled 2018-06-17: qty 1

## 2018-06-17 MED ORDER — PANTOPRAZOLE SODIUM 40 MG PO TBEC
40.0000 mg | DELAYED_RELEASE_TABLET | Freq: Every day | ORAL | Status: DC
  Administered 2018-06-17 – 2018-06-18 (×2): 40 mg via ORAL
  Filled 2018-06-17 (×2): qty 1

## 2018-06-17 MED ORDER — KETOROLAC TROMETHAMINE 30 MG/ML IJ SOLN
15.0000 mg | Freq: Once | INTRAMUSCULAR | Status: AC
  Administered 2018-06-17: 15 mg via INTRAVENOUS
  Filled 2018-06-17: qty 1

## 2018-06-17 NOTE — Progress Notes (Addendum)
Subjective: Patient reports "I've had left leg pain in the past, but never the pain I'm having in this right leg"  Objective: Vital signs in last 24 hours: Temp:  [97.6 F (36.4 C)-98.2 F (36.8 C)] 98.2 F (36.8 C) (06/27 0759) Pulse Rate:  [88-97] 89 (06/27 0759) Resp:  [18-20] 20 (06/27 0759) BP: (116-139)/(73-81) 116/73 (06/27 0759) SpO2:  [96 %-100 %] 97 % (06/27 0759) Weight:  [81.6 kg (180 lb)-88.8 kg (195 lb 12.3 oz)] 88.8 kg (195 lb 12.3 oz) (06/27 0759)  Intake/Output from previous day: No intake/output data recorded. Intake/Output this shift: Total I/O In: 360 [P.O.:360] Out: -   Alert, conversant, reporting no pain at present. She notes lumbar and right leg pain with movement, severe RLE pain with standing. Lumbar symptoms intermittent "for years", Left thigh pain 2 yrs ago. Current episode of increasing lumbar pain and RLE pain with movement/weight bearing has been increasing over the past 58mos. Two courses of steroid offered no relief. MRI late May at [facility]. She came to the ER yesterday with increasing RLE pain, stating she could not wait 19mos to be seen in office.  Exam reveals good strength BUE, Good strength LLE, effort limited by pain RLE. Passive ROM RLE elicits no pain. She denies bowel or bladder issues other than occasional urgency. She reports two recent falls, attributed to RLE "giving way" - superficial lacerations both shins.   Lab Results: Recent Labs    06/17/18 0503  WBC 9.0  HGB 11.3*  HCT 36.2  PLT 275   BMET Recent Labs    06/17/18 0503  NA 137  K 4.5  CL 106  CO2 23  GLUCOSE 114*  BUN 13  CREATININE 0.83  CALCIUM 8.4*    Studies/Results: No results found.  Assessment/Plan:   LOS: 0 days  Will continue to mobilize as tolerated, controlling pain. Dr. Vertell Limber will visit after OR this am. Pt understands Dr. Vertell Limber may wish to repeat MRI, given May MRI is unavailable for viewing and her symptoms have worsened.   Verdis Prime 06/17/2018, 9:53 AM

## 2018-06-17 NOTE — ED Triage Notes (Signed)
Pt presents with multiple complaints. Pt reports lower back pain with radiation into bilateral legs. Pain is worse on the R leg. Pt states pain is so bad it is causing her legs to give out and make her fall. Pt also reports a cough.

## 2018-06-17 NOTE — Care Management (Signed)
This is a no charge note  Pending admission per Dr. Christy Gentles  67 year old lady with a past medical history of hypertension, hyperlipidemia, depression with anxiety, who presents with lower back pain.  Patient back pain has been going on since May.  She had MRI lumbar spine without contrast performed May 20, 2018 at [facility] revealed mild worsening spinal stenosis, but critical degree of stenosis not established.  There is also neural impingement at L2/3 and L3/4. Today she has intractable back pain, cannot stand up and walk.  Patient may need pain control, PT OT and possible neurosurgeon consultation.  Patient is placed on MedSurg bed for observation.  Ivor Costa, MD  [facility] Pager [phone]  If 7PM-7AM, please contact night-coverage [email or web address] Password [facility] 06/17/2018, 6:46 AM

## 2018-06-17 NOTE — ED Notes (Signed)
Pt attempted to get to wheelchair to go to restroom; pt having difficulty putting weight on R leg and is unable to get up to the wheelchair with assistance from 2 staff members. Pt assisted back to bed and placed on bedpan

## 2018-06-17 NOTE — H&P (Signed)
History and Physical    Rebecca Reynolds:[id number] DOB: 1951-11-14 DOA: 06/17/2018  **Will place patient in observation status based on the expectation that the patient will need hospitalization/ hospital care for less than or equal to 24 hours  PCP: Cher Nakai, MD   Attending physician: Purohit  Patient coming from/Resides with: Private residence  Chief Complaint: Intractable low back pain, RLE pain and numbness, gait instability with falls  HPI: Rebecca Reynolds is a 67 y.o. female with medical history significant for obesity with BMI 35, hypertension, mild nonobstructive CAD based on cardiac catheterization 2017, EMEA, anxiety and depression and insomnia.  Patient reports long-standing history of intermittent back pain.  Over the past month and a half she has developed problems with low back pain radiating into the right leg with intermittent numbness and tingling over the past 2 to 3 weeks that has become more constant over the past week.  She underwent an MRI at [facility] reported to have revealed mild worsening spinal stenosis with neural impingement at L2/3 and L3/4.  Since onset of symptoms she has had several unintended falls.  Over the past 24 hours despite utilizing a walker she has had worsening back pain and has been unable to stand or walk or bear weight on the right lower extremity.  Labs in the ER have been unremarkable.  Patient reports that since onset of symptoms she has had 2 steroid tapers without any significant improvement in her symptoms.  She has also been started on Flexeril which has eased but has not resolved the symptoms.  She has not had any bowel or bladder incontinence.  ED Course:  Vital Signs: BP 137/80   Pulse 92   Temp 97.6 F (36.4 C) (Oral)   Resp 18   Ht 5' (1.524 m)   Wt 81.6 kg (180 lb)   SpO2 96%   BMI 35.15 kg/m  MR lumbar spine at outside facility: See above Lab data: Sodium 137, potassium 4.5, chloride 106, CO2 23, glucose 114, BUN  13, creatinine 0.83, anion gap 8, white count 9000 with normal differential Medications and treatments: Dilaudid 1 mg IV x2 doses, Tessalon Perle 100 mg x 1  Review of Systems:  In addition to the HPI above,  No Fever-chills, myalgias or other constitutional symptoms No Headache, changes with Vision or hearing, dizziness, dysarthria or word finding difficulty, gait disturbance or imbalance, tremors or seizure activity No problems swallowing food or Liquids, indigestion/reflux, choking or coughing while eating, abdominal pain with or after eating No Chest pain, Cough or Shortness of Breath, palpitations, orthopnea or DOE No Abdominal pain, N/V, melena,hematochezia, dark tarry stools, constipation No dysuria, malodorous urine, hematuria or flank pain No new skin rashes, lesions, masses  No new joint pains, aches, swelling or redness No recent unintentional weight gain or loss No polyuria, polydypsia or polyphagia   Past Medical History:  Diagnosis Date  . Anxiety   . Back pain   . CAD (nonobstructive, mild LAD per cath 2017)   . Depression   . Hypercholesteremia   . Hypertension   . Left hip pain   . Neck pain   . Obesity, Class II, BMI 35-39.9     Past Surgical History:  Procedure Laterality Date  . disc repair  2008    Social History   Socioeconomic History  . Marital status: Divorced    Spouse name: Not on file  . Number of children: 4  . Years of education: 30  .  Highest education level: Not on file  Occupational History  . Occupation: disability   Social Needs  . Financial resource strain: Not on file  . Food insecurity:    Worry: Not on file    Inability: Not on file  . Transportation needs:    Medical: Not on file    Non-medical: Not on file  Tobacco Use  . Smoking status: Never Smoker  . Smokeless tobacco: Never Used  Substance and Sexual Activity  . Alcohol use: No    Alcohol/week: 0.0 oz  . Drug use: No  . Sexual activity: Not on file  Lifestyle    . Physical activity:    Days per week: Not on file    Minutes per session: Not on file  . Stress: Not on file  Relationships  . Social connections:    Talks on phone: Not on file    Gets together: Not on file    Attends religious service: Not on file    Active member of club or organization: Not on file    Attends meetings of clubs or organizations: Not on file    Relationship status: Not on file  . Intimate partner violence:    Fear of current or ex partner: Not on file    Emotionally abused: Not on file    Physically abused: Not on file    Forced sexual activity: Not on file  Other Topics Concern  . Not on file  Social History Narrative   Lives with son    Drinks a pepsi a day   Right handed     Mobility: Recently begin requiring a rolling walker for ambulation due to gait instability, RLE pain and numbness and recent falls Work history: Not obtained   Allergies  Allergen Reactions  . Wasp Venom Anaphylaxis    Family History  Problem Relation Age of Onset  . Diabetes Mother   . Heart attack Father   . Diabetes Sister   . Diabetes Sister   . Diabetes Brother   . Hyperlipidemia Sister   . Hyperlipidemia Sister   . Hyperlipidemia Brother      Prior to Admission medications   Medication Sig Start Date End Date Taking? Authorizing Provider  atorvastatin (LIPITOR) 80 MG tablet Take 80 mg by mouth daily. 03/21/15  Yes [provider]  cyclobenzaprine (FLEXERIL) 5 MG tablet Take 5 mg by mouth every 6 (six) hours.  05/30/18  Yes [provider]  gabapentin (NEURONTIN) 600 MG tablet Take 600 mg by mouth 3 (three) times daily as needed (for nerve pain).  03/19/15  Yes [provider]  nitroGLYCERIN (NITROSTAT) 0.4 MG SL tablet Place 0.4 mg under the tongue every 5 (five) minutes as needed for chest pain.  10/22/16  Yes [provider]  omeprazole (PRILOSEC) 20 MG capsule Take 20 mg by mouth 2 (two) times daily before a meal.   Yes [provider]  oxyCODONE-acetaminophen (PERCOCET) 10-325 MG per tablet Take 1 tablet by mouth every 8 (eight) hours as needed for pain.  03/21/15  Yes [provider]  zolpidem (AMBIEN CR) 12.5 MG CR tablet Take 12.5 mg by mouth at bedtime.    Yes [provider]    Physical Exam: Vitals:   06/17/18 0406 06/17/18 0430 06/17/18 0530 06/17/18 0702  BP: 139/81 125/73 127/78 137/80  Pulse: 97 94 88 92  Resp: 18   18  Temp: 97.6 F (36.4 C)     TempSrc: Oral  SpO2: 100% 97% 96% 96%  Weight: 81.6 kg (180 lb)     Height: 5' (1.524 m)         Constitutional: NAD, calm, uncomfortable 2/2 ongoing low back pain with RLE radiculopathy Eyes: PERRL, lids and conjunctivae normal ENMT: Mucous membranes are moist. Posterior pharynx clear of any exudate or lesions.Normal dentition.  Neck: normal, supple, no masses, no thyromegaly Respiratory: clear to auscultation bilaterally, no wheezing, no crackles. Normal respiratory effort. No accessory muscle use.  Cardiovascular: Regular rate and rhythm, no murmurs / rubs / gallops. No extremity edema. 2+ pedal pulses. No carotid bruits.  Abdomen: no tenderness, no masses palpated. No hepatosplenomegaly. Bowel sounds positive.  Musculoskeletal: no clubbing / cyanosis. No joint deformity upper and lower extremities. Good ROM, no contractures. Normal muscle tone.  Skin: no rashes, lesions, ulcers. No induration Neurologic: CN 2-12 grossly intact. Sensation intact, DTR: Hyperreflexive patellar 3+ on right, normal on left. Strength 5/5 except for RLE w/ positive SLR ans strength 4/5 Psychiatric: Normal judgment and insight. Alert and oriented x 3. Normal mood.    Labs on Admission: I have personally reviewed following labs and imaging studies  CBC: Recent Labs  Lab 06/17/18 0503  WBC 9.0  NEUTROABS 5.7  HGB 11.3*  HCT 36.2  MCV 93.1  PLT 956   Basic Metabolic Panel: Recent Labs  Lab 06/17/18 0503  NA 137  K 4.5  CL 106  CO2  23  GLUCOSE 114*  BUN 13  CREATININE 0.83  CALCIUM 8.4*   GFR: Estimated Creatinine Clearance: 63.9 mL/min (by C-G formula based on SCr of 0.83 mg/dL). Liver Function Tests: No results for input(s): AST, ALT, ALKPHOS, BILITOT, PROT, ALBUMIN in the last 168 hours. No results for input(s): LIPASE, AMYLASE in the last 168 hours. No results for input(s): AMMONIA in the last 168 hours. Coagulation Profile: No results for input(s): INR, PROTIME in the last 168 hours. Cardiac Enzymes: No results for input(s): CKTOTAL, CKMB, CKMBINDEX, TROPONINI in the last 168 hours. BNP (last 3 results) No results for input(s): PROBNP in the last 8760 hours. HbA1C: No results for input(s): HGBA1C in the last 72 hours. CBG: No results for input(s): GLUCAP in the last 168 hours. Lipid Profile: No results for input(s): CHOL, HDL, LDLCALC, TRIG, CHOLHDL, LDLDIRECT in the last 72 hours. Thyroid Function Tests: No results for input(s): TSH, T4TOTAL, FREET4, T3FREE, THYROIDAB in the last 72 hours. Anemia Panel: No results for input(s): VITAMINB12, FOLATE, FERRITIN, TIBC, IRON, RETICCTPCT in the last 72 hours. Urine analysis: No results found for: COLORURINE, APPEARANCEUR, LABSPEC, PHURINE, GLUCOSEU, HGBUR, BILIRUBINUR, KETONESUR, PROTEINUR, UROBILINOGEN, NITRITE, LEUKOCYTESUR Sepsis Labs: @LABRCNTIP (procalcitonin:4,lacticidven:4) )No results found for this or any previous visit (from the past 240 hour(s)).   Radiological Exams on Admission: No results found.   Assessment/Plan Principal Problem:   Low back pain radiating to right leg/ Intractable back pain/Gait instability -Patient presents with progressive low back pain and RLE radiculopathy with worsening numbness, gait instability and multiple falls and intractable pain -Does have a mildly abnormal neurological exam with weakness, decreased sensation and hyper reflexive DTR on RLE -Formal neurosurgical consultation pending-anticipate outpatient  follow-up and if indicated elective outpatient surgery given neurological exam not consistent with emergent neural impingement -Scheduled Toradol 15 mg IV every 6 hours -Ultram as needed moderate pain -OxyIR as needed severe pain (narcotic nave) -Lidoderm patch to right hip and leg -Tylenol for mild pain and or fever -Continue preadmission Flexeril and Neurontin -PT/OT evaluation; pending evaluation although anticipated patient may  be ready for discharge to home on 6/28 but given the fact she lives alone and has been falling therapy evaluation may indicate patient needs temporary placement for rehabilitative therapies until can undergo surgery-please keep this in mind regarding discharge timing and disposition  Active Problems:   Hypercholesteremia -Continue pre-admission statin    Hypertension -Was not medications prior to admission    CAD (coronary artery disease) -Underwent cardiac catheterization in 2017 with only mild disease noted in LAD; recommendation was for beta-blocker-unclear why patient currently not taking -On statin but not on aspirin prior to admission    Obesity, Class II, BMI 35-39.9 -Weight reduction strategies per PCP -I suspect obesity also influencing issues with low back pain    Anxiety/Depression  -Not on antidepressants but patient apparently has issues with insomnia and PCP has prescribed Ambien CR 12.5 mg at bedtime which we will continue while here    **Additional lab, imaging and/or diagnostic evaluation at discretion of supervising physician  DVT prophylaxis: Lovenox Code Status: Full Family Communication: No family at bedside Disposition Plan: TBD pending PT/OT evaluation-for now anticipate discharge to home with home health PT follow-up Consults called: Neurosurgery/[facility]    ELLIS,ALLISON L. ANP-BC [facility] Pager [phone]   If 7PM-7AM, please contact night-coverage [email or web address] Password [facility]  06/17/2018, 7:58 AM

## 2018-06-17 NOTE — Evaluation (Signed)
Occupational Therapy Evaluation Patient Details Name: Rebecca Reynolds MRN: [id number] DOB: Apr 14, 1951 Today's Date: 06/17/2018    History of Present Illness Patient is a 67 y/o female presenting with acute on chronic low back pain with R sided radicular symptoms. Patient with a PMH significant for class II obesity, hypertension, nonobstructive coronary artery disease, lumbago, anxiety, depression. 1 month ago seen at [facility] with worsening stenosis and impingement on L2/3, L3/4. Has had falls due to increased back pain.    Clinical Impression   PTA Pt independent in ADL/IADL and mobility. HAs required assistance for the past 2 weeks and her son/daughter/husband have been helping her. Currently she presents with varying levels and inconsistencies during session for pain tolerance for WB through RLE. She describes the pain as "being struck by lightning from her back down to her toes through her whole leg." At this time she can perform UB ADL in sitting with set-up but is max A for LB ADL and mod A for transfers. Pt will require short term SNF stay to maximize safety and independence in ADL and functional transfers. Next session to focus on standing balance and progress to stand pivot transfer for toileting needs.     Follow Up Recommendations  SNF;Supervision/Assistance - 24 hour    Equipment Recommendations  Tub/shower seat;3 in 1 bedside commode    Recommendations for Other Services       Precautions / Restrictions Precautions Precautions: Fall Restrictions Weight Bearing Restrictions: No      Mobility Bed Mobility Overal bed mobility: Needs Assistance Bed Mobility: Supine to Sit;Sit to Supine     Supine to sit: Mod assist Sit to supine: Mod assist   General bed mobility comments: initiates bringing LE towards EOB, however does not complete activity without physical assist  Transfers Overall transfer level: Needs assistance Equipment used: Rolling walker (2  wheeled) Transfers: Sit to/from Stand Sit to Stand: Mod assist         General transfer comment: patient moaning during transfer, with reduced weight acceptance onto R LE    Balance Overall balance assessment: Needs assistance Sitting-balance support: No upper extremity supported;Feet supported Sitting balance-Leahy Scale: Good     Standing balance support: Bilateral upper extremity supported;During functional activity Standing balance-Leahy Scale: Fair                             ADL either performed or assessed with clinical judgement   ADL Overall ADL's : Needs assistance/impaired Eating/Feeding: Independent;Sitting   Grooming: Sitting;Set up Grooming Details (indicate cue type and reason): unable to maintain standing for ADL Upper Body Bathing: Set up;Sitting   Lower Body Bathing: Moderate assistance;Sitting/lateral leans   Upper Body Dressing : Set up;Sitting   Lower Body Dressing: Maximal assistance;Sit to/from stand   Toilet Transfer: Moderate assistance Toilet Transfer Details (indicate cue type and reason): simulated transfer with side steps up bed Toileting- Clothing Manipulation and Hygiene: Maximal assistance;Sit to/from stand Toileting - Clothing Manipulation Details (indicate cue type and reason): requires BUE for balance and support on RW     Functional mobility during ADLs: (unable at this time)       Vision Baseline Vision/History: Wears glasses Wears Glasses: Reading only Patient Visual Report: No change from baseline Vision Assessment?: No apparent visual deficits     Perception     Praxis      Pertinent Vitals/Pain Pain Assessment: 0-10 Pain Score: 10-Worst pain ever(with activity) Pain Location: low back Pain  Descriptors / Indicators: Aching;Discomfort;Grimacing;Moaning Pain Intervention(s): Limited activity within patient's tolerance;Monitored during session;Repositioned     Hand Dominance Right   Extremity/Trunk  Assessment Upper Extremity Assessment Upper Extremity Assessment: Overall WFL for tasks assessed   Lower Extremity Assessment Lower Extremity Assessment: Defer to PT evaluation   Cervical / Trunk Assessment Cervical / Trunk Assessment: Normal   Communication Communication Communication: No difficulties   Cognition Arousal/Alertness: Awake/alert Behavior During Therapy: WFL for tasks assessed/performed Overall Cognitive Status: Within Functional Limits for tasks assessed                                     General Comments  Pt's husband has bad back - son and daughter able to assist at home    Exercises     Shoulder Instructions      Home Living Family/patient expects to be discharged to:: Private residence Living Arrangements: Spouse/significant other Available Help at Discharge: Family Type of Home: House Home Access: Stairs to enter Technical brewer of Steps: 4 Entrance Stairs-Rails: None Home Layout: One level     Bathroom Shower/Tub: Corporate investment banker: Standard Bathroom Accessibility: Yes   Home Equipment: Environmental consultant - 2 wheels;Cane - quad;Hospital bed          Prior Functioning/Environment Level of Independence: Independent                 OT Problem List: Decreased range of motion;Decreased activity tolerance;Impaired balance (sitting and/or standing);Impaired sensation;Pain      OT Treatment/Interventions: Self-care/ADL training;DME and/or AE instruction;Manual therapy;Therapeutic activities;Patient/family education;Balance training    OT Goals(Current goals can be found in the care plan section) Acute Rehab OT Goals Patient Stated Goal: return home, reduce pain OT Goal Formulation: With patient Time For Goal Achievement: 07/01/18 Potential to Achieve Goals: Good ADL Goals Pt Will Perform Grooming: with supervision;standing Pt Will Perform Upper Body Dressing: sitting;with modified independence Pt  Will Perform Lower Body Dressing: sit to/from stand;with supervision Pt Will Transfer to Toilet: with modified independence;ambulating Pt Will Perform Toileting - Clothing Manipulation and hygiene: with modified independence;sit to/from stand  OT Frequency: Min 2X/week   Barriers to D/C:            Co-evaluation              AM-PAC PT "6 Clicks" Daily Activity     Outcome Measure Help from another person eating meals?: None Help from another person taking care of personal grooming?: A Little Help from another person toileting, which includes using toliet, bedpan, or urinal?: A Lot Help from another person bathing (including washing, rinsing, drying)?: A Lot Help from another person to put on and taking off regular upper body clothing?: A Little Help from another person to put on and taking off regular lower body clothing?: A Lot 6 Click Score: 16   End of Session Equipment Utilized During Treatment: Gait belt;Rolling walker Nurse Communication: Mobility status  Activity Tolerance: Patient limited by pain Patient left: in bed;with call bell/phone within reach;with bed alarm set  OT Visit Diagnosis: Unsteadiness on feet (R26.81);Other abnormalities of gait and mobility (R26.89);Pain Pain - Right/Left: Right Pain - part of body: Leg;Hip                Time: 3299-2426 OT Time Calculation (min): 20 min Charges:  OT General Charges $OT Visit: 1 Visit OT Evaluation $OT Eval Moderate Complexity: 1 Mod G-Codes:  Hulda Humphrey OTR/L [place] 06/17/2018, 5:03 PM

## 2018-06-17 NOTE — ED Notes (Signed)
Pt placed back on the bedpan

## 2018-06-17 NOTE — ED Notes (Signed)
Pt requesting to stay on bedpan a little bit longer

## 2018-06-17 NOTE — ED Notes (Signed)
Pt unable to use bedpan 

## 2018-06-17 NOTE — ED Provider Notes (Signed)
[facility] [place] EMERGENCY DEPARTMENT Provider Note   CSN: [id number] Arrival date & time: 06/17/18  0358     History   Chief Complaint Chief Complaint  Patient presents with  . Leg Pain    HPI Rebecca Reynolds is a 67 y.o. female.  The history is provided by the patient.  Leg Pain   This is a new problem. The current episode started more than 1 week ago. The problem occurs daily. The problem has been gradually worsening. Pain location: Right leg. The pain is severe. Associated symptoms include limited range of motion. The symptoms are aggravated by standing. Treatments tried: Oral narcotics. The treatment provided no relief. There has been no history of extremity trauma.   Patient with history of anxiety, hyperlipidemia, hypertension presents with low back pain radiating to her legs.  She reports the pain is worse going into her right leg.  No recent trauma.  She reports this started to worsen at the end of May.  She was seen at an outside hospital and had imaging performed.  She reports she does not recall being referred to a spinal surgeon, but only has referrals to pain management No fever or vomiting.  She reports at times she will leak urine, but no overt urinary incontinence, and no fecal incontinence Denies obvious weakness in the legs but it does hurt to move her legs No history of lumbar spinal surgery She reports since being seen in May, her pain and ambulation is worsening.  She does report falls since then but none in the past 48 hours  Patient denies any trauma to her pelvis/ hip. Past Medical History:  Diagnosis Date  . Anxiety   . Back pain   . Depression   . Hypercholesteremia   . Hypertension   . Left hip pain   . Neck pain     Patient Active Problem List   Diagnosis Date Noted  . HYPERLIPIDEMIA 07/11/2009  . ANXIETY 07/11/2009  . DEPRESSION 07/11/2009  . RESTLESS LEG SYNDROME 07/11/2009  . CARPAL TUNNEL SYNDROME 07/11/2009  . HYPERTENSION  07/11/2009  . G E R D 07/11/2009  . BACK PAIN, CHRONIC 07/11/2009  . INSOMNIA 07/11/2009  . COUGH 07/11/2009    Past Surgical History:  Procedure Laterality Date  . disc repair  2008     OB History   None      Home Medications    Prior to Admission medications   Medication Sig Start Date End Date Taking? Authorizing Provider  ALPRAZolam Duanne Moron) 1 MG tablet  03/21/15   [provider]  atorvastatin (LIPITOR) 80 MG tablet Take 80 mg by mouth daily. 03/21/15   [provider]  cyclobenzaprine (FLEXERIL) 10 MG tablet  03/05/15   [provider]  gabapentin (NEURONTIN) 600 MG tablet  03/19/15   [provider]  oxyCODONE-acetaminophen (PERCOCET) 10-325 MG per tablet  03/21/15   [provider]  zolpidem (AMBIEN CR) 12.5 MG CR tablet Take 10 mg by mouth at bedtime.    [provider]  zolpidem (AMBIEN) 10 MG tablet Take 10 mg by mouth at bedtime as needed. 03/21/15   [provider]    Family History Family History  Problem Relation Age of Onset  . Diabetes Mother   . Heart attack Father   . Diabetes Sister   . Diabetes Sister   . Diabetes Brother   . Hyperlipidemia Sister   . Hyperlipidemia Sister   . Hyperlipidemia Brother  Social History Social History   Tobacco Use  . Smoking status: Never Smoker  . Smokeless tobacco: Never Used  Substance Use Topics  . Alcohol use: No    Alcohol/week: 0.0 oz  . Drug use: No     Allergies   Patient has no known allergies.   Review of Systems Review of Systems  Constitutional: Negative for fever.  Respiratory: Positive for cough.        Patient reports cough for several days  Cardiovascular: Negative for chest pain.  Gastrointestinal: Positive for abdominal distention. Negative for vomiting.  Musculoskeletal: Positive for arthralgias and back pain.  Neurological: Positive for tremors.  All other systems reviewed and are negative.    Physical  Exam Updated Vital Signs BP 139/81 (BP Location: Right Arm)   Pulse 97   Temp 97.6 F (36.4 C) (Oral)   Resp 18   Ht 1.524 m (5')   Wt 81.6 kg (180 lb)   SpO2 100%   BMI 35.15 kg/m   Physical Exam  CONSTITUTIONAL: Elderly and anxious HEAD: Normocephalic/atraumatic EYES: EOMI/PERRL ENMT: Mucous membranes moist NECK: supple no meningeal signs SPINE/BACK:entire spine nontender , no bruising/crepitance/stepoffs noted to spine, paraspinal tenderness noted CV: S1/S2 noted, no murmurs/rubs/gallops noted LUNGS: Lungs are clear to auscultation bilaterally, no apparent distress ABDOMEN: soft, nontender, no rebound or guarding GU:no cva tenderness NEURO: Awake/alert, equal motor 5/5 strength noted with knee flexion/extension, foot dorsi/plantar flexion, great toe extension intact bilaterally, no clonus bilaterally, plantar reflex appropriate (toes downgoing), no sensory deficit in any dermatome.  Equal patellar/achilles reflex noted in bilateral lower extremities. Appropriate strength with left hip flexion, right hip flexion limited due to significant pain EXTREMITIES: pulses normal, full ROM, pelvis stable, no deformities SKIN: warm, color normal PSYCH: no abnormalities of mood noted, alert and oriented to situation   ED Treatments / Results  Labs (all labs ordered are listed, but only abnormal results are displayed) Labs Reviewed  BASIC METABOLIC PANEL - Abnormal; Notable for the following components:      Result Value   Glucose, Bld 114 (*)    Calcium 8.4 (*)    All other components within normal limits  CBC WITH DIFFERENTIAL/PLATELET - Abnormal; Notable for the following components:   Hemoglobin 11.3 (*)    All other components within normal limits    EKG None  Radiology No results found.  Procedures Procedures   Medications Ordered in ED Medications  ketorolac (TORADOL) 30 MG/ML injection 15 mg (has no administration in time range)  HYDROmorphone (DILAUDID)  injection 1 mg (1 mg Intravenous Given 06/17/18 0511)  benzonatate (TESSALON) capsule 100 mg (100 mg Oral Given 06/17/18 0556)  HYDROmorphone (DILAUDID) injection 1 mg (1 mg Intravenous Given 06/17/18 0607)     Initial Impression / Assessment and Plan / ED Course  I have reviewed the triage vital signs and the nursing notes.  Pertinent labs  results that were available during my care of the patient were reviewed by me and considered in my medical decision making (see chart for details).     5:44 AM MRI lumbar spine without contrast performed May 20, 2018 at [facility] revealed mild worsening spinal stenosis, but critical degree of stenosis not established.  There is also neural impingement at L2/3 and L3/4. Narcotic database reviewed and considered in decision making Patient had a Percocet prescription filled for 30-day supply on June 13 Plan in the emergency department is to treat pain and reassess.   6:33 AM Patient has significant difficulty  standing up.  She required 2 person assistance to stand. While lying in the bed, her neuro exam is unchanged.  She is unable to urinate as she does not like using a bedpan, I do not feel that this represents acute urinary retention or cauda equina.  I do feel that her spinal stenosis and neural impingement are worsening, resulting in difficulty walking.  We will consult medical service for admission 6:59 AM Discussed with Dr. Blaine Hamper for admission.  I feel she would benefit from admission for pain control, physical therapy, and neurosurgery consult.  Patient has had  escalating symptoms since her last evaluation on May 30. Final Clinical Impressions(s) / ED Diagnoses   Final diagnoses:  Intractable back pain  Lumbosacral radiculopathy  Spinal stenosis of lumbar region, unspecified whether neurogenic claudication present    ED Discharge Orders    None       Ripley Fraise, MD 06/17/18 0700

## 2018-06-17 NOTE — ED Notes (Signed)
Pt denies urgency to urinate at present; reports leaking urine worsening when coughing. Bladder scanner and bedside commode at bedside for when pt needs to urinate

## 2018-06-17 NOTE — Evaluation (Signed)
Physical Therapy Evaluation Patient Details Name: Rebecca Reynolds MRN: [id number] DOB: 08-Jun-1951 Today's Date: 06/17/2018   History of Present Illness  Patient is a 67 y/o female presenting with acute on chronic low back pain with R sided radicular symptoms. Patient with a PMH significant for class II obesity, hypertension, nonobstructive coronary artery disease, lumbago, anxiety, depression. 1 month ago seen at [facility] with worsening stenosis and impingement on L2/3, L3/4. Has had falls due to increased back pain.   Clinical Impression  [name] admitted with the above listed diagnosis. Patient reporting that prior to admission was independent with all mobility, but now unable to bear weight onto R LE and unable to ambulate. Patient requiring Mod A for bed mobility and transfers with inconsistencies in clinical presentation. When standing at bedside unable to bear weight through R LE, however then able to side step at bedside with full weight bearing without pain provocation or verbalizing pain. PT currently recommending short term SNF stay due to reduced functional mobility. PT to continue to follow.     Follow Up Recommendations SNF;Supervision/Assistance - 24 hour    Equipment Recommendations  Rolling walker with 5" wheels    Recommendations for Other Services       Precautions / Restrictions Precautions Precautions: Fall Restrictions Weight Bearing Restrictions: No      Mobility  Bed Mobility Overal bed mobility: Needs Assistance Bed Mobility: Supine to Sit;Sit to Supine     Supine to sit: Mod assist Sit to supine: Mod assist   General bed mobility comments: initiates bringing LE towards EOB, however does not complete activity without physical assist  Transfers Overall transfer level: Needs assistance Equipment used: Rolling walker (2 wheeled) Transfers: Sit to/from Stand Sit to Stand: Mod assist         General transfer comment: patient moaning during transfer,  with reduced weight acceptance onto R LE  Ambulation/Gait             General Gait Details: deferred due to pain; able to side step towards L with weigth acceptance onto R LE  Stairs            Wheelchair Mobility    Modified Rankin (Stroke Patients Only)       Balance Overall balance assessment: Needs assistance Sitting-balance support: No upper extremity supported;Feet supported Sitting balance-Leahy Scale: Good     Standing balance support: Bilateral upper extremity supported;During functional activity Standing balance-Leahy Scale: Fair                               Pertinent Vitals/Pain Pain Assessment: 0-10 Pain Score: 10-Worst pain ever(with activity) Pain Location: low back Pain Descriptors / Indicators: Aching;Discomfort;Grimacing;Moaning Pain Intervention(s): Limited activity within patient's tolerance;Monitored during session;Repositioned    Home Living Family/patient expects to be discharged to:: Private residence Living Arrangements: Spouse/significant other Available Help at Discharge: Family Type of Home: House Home Access: Stairs to enter Entrance Stairs-Rails: None Entrance Stairs-Number of Steps: 4 Home Layout: One level Home Equipment: Environmental consultant - 2 wheels;Cane - quad;Hospital bed      Prior Function Level of Independence: Independent               Hand Dominance   Dominant Hand: Right    Extremity/Trunk Assessment   Upper Extremity Assessment Upper Extremity Assessment: Defer to OT evaluation    Lower Extremity Assessment Lower Extremity Assessment: Generalized weakness    Cervical / Trunk Assessment Cervical / Trunk  Assessment: Normal  Communication   Communication: No difficulties  Cognition Arousal/Alertness: Awake/alert Behavior During Therapy: WFL for tasks assessed/performed Overall Cognitive Status: Within Functional Limits for tasks assessed                                         General Comments      Exercises     Assessment/Plan    PT Assessment Patient needs continued PT services  PT Problem List Decreased strength;Decreased activity tolerance;Decreased balance;Decreased mobility;Decreased knowledge of use of DME;Decreased safety awareness       PT Treatment Interventions DME instruction;Gait training;Stair training;Functional mobility training;Therapeutic activities;Therapeutic exercise;Balance training;Neuromuscular re-education;Patient/family education    PT Goals (Current goals can be found in the Care Plan section)  Acute Rehab PT Goals Patient Stated Goal: return home, reduce pain PT Goal Formulation: With patient Time For Goal Achievement: 07/01/18 Potential to Achieve Goals: Good    Frequency Min 2X/week   Barriers to discharge        Co-evaluation               AM-PAC PT "6 Clicks" Daily Activity  Outcome Measure Difficulty turning over in bed (including adjusting bedclothes, sheets and blankets)?: A Lot Difficulty moving from lying on back to sitting on the side of the bed? : Unable Difficulty sitting down on and standing up from a chair with arms (e.g., wheelchair, bedside commode, etc,.)?: Unable Help needed moving to and from a bed to chair (including a wheelchair)?: A Little Help needed walking in hospital room?: A Lot Help needed climbing 3-5 steps with a railing? : Total 6 Click Score: 10    End of Session Equipment Utilized During Treatment: Gait belt Activity Tolerance: Patient limited by pain Patient left: in bed;with call bell/phone within reach;with bed alarm set Nurse Communication: Mobility status PT Visit Diagnosis: Unsteadiness on feet (R26.81);Other abnormalities of gait and mobility (R26.89);Muscle weakness (generalized) (M62.81)    Time: 2725-3664 PT Time Calculation (min) (ACUTE ONLY): 17 min   Charges:   PT Evaluation $PT Eval Moderate Complexity: 1 Mod     PT G Codes:        Lanney Gins,  PT, DPT 06/17/18 3:30 PM

## 2018-06-17 NOTE — Progress Notes (Signed)
Patient voiced, post void residual of 398 ML at this time, pt denied any discomfort. Abdomen appears obese but soft. RN offer to straight cath to empty bladder but pt declined. Ave Filter, RN

## 2018-06-18 DIAGNOSIS — R2681 Unsteadiness on feet: Secondary | ICD-10-CM

## 2018-06-18 DIAGNOSIS — M545 Low back pain: Secondary | ICD-10-CM

## 2018-06-18 DIAGNOSIS — E7800 Pure hypercholesterolemia, unspecified: Secondary | ICD-10-CM

## 2018-06-18 DIAGNOSIS — I10 Essential (primary) hypertension: Secondary | ICD-10-CM | POA: Diagnosis not present

## 2018-06-18 DIAGNOSIS — M48061 Spinal stenosis, lumbar region without neurogenic claudication: Secondary | ICD-10-CM | POA: Diagnosis not present

## 2018-06-18 DIAGNOSIS — M5126 Other intervertebral disc displacement, lumbar region: Secondary | ICD-10-CM | POA: Diagnosis not present

## 2018-06-18 LAB — BASIC METABOLIC PANEL
Anion gap: 7 (ref 5–15)
BUN: 16 mg/dL (ref 8–23)
CHLORIDE: 105 mmol/L (ref 98–111)
CO2: 28 mmol/L (ref 22–32)
CREATININE: 0.96 mg/dL (ref 0.44–1.00)
Calcium: 9.1 mg/dL (ref 8.9–10.3)
GFR calc Af Amer: >60 mL/min (ref >60)
Glucose, Bld: 112 mg/dL — ABNORMAL HIGH (ref 70–99)
POTASSIUM: 4.7 mmol/L (ref 3.5–5.1)
SODIUM: 140 mmol/L (ref 135–145)

## 2018-06-18 MED ORDER — OXYCODONE-ACETAMINOPHEN 10-325 MG PO TABS: 1.0000 | ORAL_TABLET | Freq: Four times a day (QID) | ORAL | 0 refills | Status: AC | PRN

## 2018-06-18 MED ORDER — SENNA 8.6 MG PO TABS: 1.0000 | ORAL_TABLET | Freq: Two times a day (BID) | ORAL | 0 refills | Status: DC

## 2018-06-18 MED ORDER — LIDOCAINE 5 % EX PTCH
2.0000 | MEDICATED_PATCH | CUTANEOUS | 1 refills | Status: AC
Start: 2018-06-19 — End: ?

## 2018-06-18 NOTE — Progress Notes (Signed)
Physical Therapy Treatment Patient Details Name: Rebecca Reynolds MRN: [id number] DOB: 10-16-1951 Today's Date: 06/18/2018    History of Present Illness Patient is a 67 y/o female presenting with acute on chronic low back pain with R sided radicular symptoms. Patient with a PMH significant for class II obesity, hypertension, nonobstructive coronary artery disease, lumbago, anxiety, depression. 1 month ago seen at [facility] with worsening stenosis and impingement on L2/3, L3/4. Has had falls due to increased back pain.     PT Comments    Patient requires min/mod A for transfers and gait training. Pt tolerated ambulating ~70ft then 52ft after seated rest break and continues to be limited by pain. Continue to progress as tolerated.    Follow Up Recommendations  SNF;Supervision/Assistance - 24 hour     Equipment Recommendations  Rolling walker with 5" wheels    Recommendations for Other Services       Precautions / Restrictions Precautions Precautions: Fall Restrictions Weight Bearing Restrictions: No    Mobility  Bed Mobility Overal bed mobility: Needs Assistance Bed Mobility: Rolling;Sidelying to Sit Rolling: Min guard Sidelying to sit: Min guard       General bed mobility comments: min guard for safety; cues for sequencing and technique and use of rails  Transfers Overall transfer level: Needs assistance Equipment used: Rolling walker (2 wheeled) Transfers: Sit to/from Stand Sit to Stand: Mod assist;Min assist         General transfer comment: assistance required to power up into standing and to gain balance upon standing and to steady RW  Ambulation/Gait Ambulation/Gait assistance: Min assist;Mod assist Gait Distance (Feet): (14ft then 67ft) Assistive device: Rolling walker (2 wheeled) Gait Pattern/deviations: Step-through pattern;Decreased stride length;Decreased weight shift to right;Trunk flexed     General Gait Details: cues for posture, safe use of RW,  sequencing, and weight shifting   Stairs             Wheelchair Mobility    Modified Rankin (Stroke Patients Only)       Balance Overall balance assessment: Needs assistance Sitting-balance support: No upper extremity supported;Feet supported Sitting balance-Leahy Scale: Good     Standing balance support: Bilateral upper extremity supported;During functional activity Standing balance-Leahy Scale: Poor                              Cognition Arousal/Alertness: Awake/alert Behavior During Therapy: WFL for tasks assessed/performed Overall Cognitive Status: Within Functional Limits for tasks assessed                                        Exercises      General Comments        Pertinent Vitals/Pain Pain Assessment: Faces Faces Pain Scale: Hurts even more Pain Location: low back and R LE Pain Descriptors / Indicators: Aching;Discomfort;Grimacing;Moaning Pain Intervention(s): Limited activity within patient's tolerance;Monitored during session;Premedicated before session;Repositioned    Home Living                      Prior Function            PT Goals (current goals can now be found in the care plan section) Acute Rehab PT Goals Patient Stated Goal: return home, reduce pain PT Goal Formulation: With patient Time For Goal Achievement: 07/01/18 Potential to Achieve Goals: Good Progress towards PT goals: Progressing  toward goals    Frequency    Min 2X/week      PT Plan Current plan remains appropriate    Co-evaluation              AM-PAC PT "6 Clicks" Daily Activity  Outcome Measure  Difficulty turning over in bed (including adjusting bedclothes, sheets and blankets)?: A Lot Difficulty moving from lying on back to sitting on the side of the bed? : Unable Difficulty sitting down on and standing up from a chair with arms (e.g., wheelchair, bedside commode, etc,.)?: Unable Help needed moving to and from a  bed to chair (including a wheelchair)?: A Little Help needed walking in hospital room?: A Lot Help needed climbing 3-5 steps with a railing? : Total 6 Click Score: 10    End of Session Equipment Utilized During Treatment: Gait belt Activity Tolerance: Patient limited by pain Patient left: with call bell/phone within reach;in chair;with chair alarm set Nurse Communication: Mobility status PT Visit Diagnosis: Unsteadiness on feet (R26.81);Other abnormalities of gait and mobility (R26.89);Muscle weakness (generalized) (M62.81)     Time: 7681-1572 PT Time Calculation (min) (ACUTE ONLY): 34 min  Charges:  $Gait Training: 8-22 mins $Therapeutic Activity: 8-22 mins                    G Codes:       Earney Navy, PTA Pager: [phone]     Darliss Cheney 06/18/2018, 2:59 PM

## 2018-06-18 NOTE — Progress Notes (Signed)
CSW acknowledging recommendations for SNF placement; however, patient not interested in SNF at this time, wants to go home. RNCM aware.  CSW signing off.  Laveda Abbe, [place] Clinical Social Worker [phone]

## 2018-06-18 NOTE — Care Management Note (Addendum)
Case Management Note  Patient Details  Name: Rebecca Reynolds MRN: [id number] Date of Birth: 02/02/1951  Subjective/Objective:                 Patient from home, lives w daughter and son. Wants to DC to home. Wants to use [facility]. Referral placed to [facility]. Patient states she has a RW, no other DME needs. Family will provide transport home.  Needs HH orders.   Action/Plan:  Notified Jermiane of orders and plan for DC today Expected Discharge Date:                  Expected Discharge Plan:  [place]  In-House Referral:     Discharge planning Services  CM Consult  Post Acute Care Choice:  Home Health Choice offered to:  Patient  DME Arranged:    DME Agency:     HH Arranged:   PT RN [place] Agency:  [facility]  Status of Service:  In process, will continue to follow  If discussed at Long Length of Stay Meetings, dates discussed:    Additional Comments:  Carles Collet, RN 06/18/2018, 10:21 AM

## 2018-06-18 NOTE — Discharge Summary (Signed)
Physician Discharge Summary  Rebecca Reynolds ZCH:[id number] DOB: 24-Nov-1951 DOA: 06/17/2018  PCP: Cher Nakai, MD  Admit date: 06/17/2018 Discharge date: 06/18/2018  Time spent: 35 minutes  Recommendations for Outpatient Follow-up:  1. Neurosurgery Dr.Stern early next week-VERY IMPORTANT 2. Home physical therapy and nursing  Discharge Diagnoses:  Principal Problem:   Low back pain radiating to right leg   L2/L3 disc herniation with radiculopathy and right leg weakness/pain   Severe spinal stenosis   Anxiety   Hypercholesteremia   Hypertension   Depression   CAD (coronary artery disease)   Obesity, Class II, BMI 35-39.9   Intractable back pain   Gait instability   Discharge Condition: slightly improved  Diet recommendation: heart healthy  Filed Weights   06/17/18 0406 06/17/18 0759  Weight: 81.6 kg (180 lb) 88.8 kg (195 lb 12.3 oz)    History of present illness:  67 year old with past medical history relevant for class II obesity, hypertension, nonobstructive coronary artery disease, lumbago, anxiety, depression who comes in with acute on chronic back pain.  She describes primarily left-sided radicular symptoms however it apparently has switched now to her right.  She was seen at [facility] approximately 1 month ago which showed worsening stenosis with impingement on L2/3 and L3/4 and she was discharged.  Since then she is continued to have worsening right-sided lower radicular symptoms including increasing weakness, pain, numbness, tingling.  She has had multiple falls due to this.  She has required a walker for the past day and the pain has been so severe that she is unable to bear weight on lower extremities.  She is completed to oral steroid tapers without much improvement.  In the ER her labs were unremarkable.  She was trialed on pain control.  She is admitted for PT/OT, possible neurosurgical evaluation, pain control  Hospital Course:   1. Acute on chronic low back pain with  L2-L3 disc herniation/severe spinal stenosis -with right leg weakness and radiculopathy -Neurosurgery Dr. Vertell Limber consulted, she advised her that she would likely need surgery given the size of disc herniation, weakness, pain and immobility, however patient really wants to avoid surgery and would like to go home with a walker, pain meds and physical therapy, he has advised her to follow-up early next week in his office, if no significant improvement surgery will be needed. -Patient feels a little better with pain control and physical therapy and would prefer to go home and try supportive care first, she understands that this will likely fail and she'll end up having to follow-up with Dr. Vertell Limber early next week in the office and require surgery in the near future -discharged home on Percocet, lidocaine patches, home health physical therapy    Hypercholesteremia -Continue pre-admission statin    CAD (coronary artery disease) -Underwent cardiac catheterization in 2017 with only mild disease noted in LAD; recommendation was for beta-blocker-unclear why patient currently not taking -On statin but not on aspirin prior to admission    Obesity, Class II, BMI 35-39.9   Consultations:  Neurosurgery Dr.Stern  Discharge Exam: Vitals:   06/18/18 0758 06/18/18 1216  BP: 140/71 104/67  Pulse: 85 89  Resp:  16  Temp: (!) 97.5 F (36.4 C) 97.8 F (36.6 C)  SpO2: 96% 94%    General: AAOx3 Cardiovascular: S1s2/RRR Respiratory: CTAB  Discharge Instructions    Allergies as of 06/18/2018      Reactions   Wasp Venom Anaphylaxis      Medication List    TAKE these  medications   atorvastatin 80 MG tablet Commonly known as:  LIPITOR Take 80 mg by mouth daily.   citalopram 40 MG tablet Commonly known as:  CELEXA Take 40 mg by mouth daily.   cyclobenzaprine 5 MG tablet Commonly known as:  FLEXERIL Take 5 mg by mouth every 6 (six) hours.   gabapentin 600 MG tablet Commonly known as:   NEURONTIN Take 600 mg by mouth 3 (three) times daily as needed (for nerve pain).   lidocaine 5 % Commonly known as:  LIDODERM Place 2 patches onto the skin daily. Remove & Discard patch within 12 hours or as directed by MD Start taking on:  06/19/2018   nitroGLYCERIN 0.4 MG SL tablet Commonly known as:  NITROSTAT Place 0.4 mg under the tongue every 5 (five) minutes as needed for chest pain.   omeprazole 20 MG capsule Commonly known as:  PRILOSEC Take 20 mg by mouth 2 (two) times daily before a meal.   oxyCODONE-acetaminophen 10-325 MG tablet Commonly known as:  PERCOCET Take 1 tablet by mouth every 6 (six) hours as needed for up to 7 days for pain. What changed:  when to take this   senna 8.6 MG Tabs tablet Commonly known as:  SENOKOT Take 1 tablet (8.6 mg total) by mouth 2 (two) times daily.   zolpidem 12.5 MG CR tablet Commonly known as:  AMBIEN CR Take 12.5 mg by mouth at bedtime.      Allergies  Allergen Reactions  . Wasp Venom Anaphylaxis   Follow-up Information    Erline Levine, MD. Schedule an appointment as soon as possible for a visit in 1 week(s).   Specialty:  Neurosurgery Contact information: [address] [place] [address] [phone]        Cher Nakai, MD. Schedule an appointment as soon as possible for a visit in 1 week(s).   Specialty:  Internal Medicine Contact information: [place] [address] [phone]            The results of significant diagnostics from this hospitalization (including imaging, microbiology, ancillary and laboratory) are listed below for reference.    Significant Diagnostic Studies: Dg Lumbar Spine Complete  Result Date: 06/17/2018 CLINICAL DATA:  Back pain. Patient barely able to stand for flexion and extension views. EXAM: LUMBAR SPINE - COMPLETE 4+ VIEW COMPARISON:  May 29, 2018 FINDINGS: Hernia mesh noted.  Other soft tissues are unremarkable. No inter pedicular  widening identified. Grade 1 anterolisthesis of L4 versus L5 measures 6.4 mm in the recumbent position, 6.6 mm in flexion and 6.6 mm with extension. No significant instability with flexion or extension at L4-5. Grade 1 anterolisthesis of L5 versus S1 is only seen when the patient is in the recumbent position measuring 3.6 mm. This is not visualized on flexion or extension views. No other malalignment. No acute fractures. Mild multilevel degenerative changes. Lower lumbar facet degenerative changes. IMPRESSION: 1. Anterolisthesis of L4 versus L5 is grade 1 and stable on flexion and extension views. 2. 3.6 mm of mild grade 1 anterolisthesis of L5 versus S1 is only seen on recumbent images, and not seen on flexion or extension views. Electronically Signed   By: Dorise Bullion III M.D   On: 06/17/2018 13:40   Mr Lumbar Spine Wo Contrast  Result Date: 06/17/2018 CLINICAL DATA:  67 y/o F; acute on chronic lower back pain with right-sided radicular symptoms. Multiple falls. EXAM: MRI LUMBAR SPINE WITHOUT CONTRAST TECHNIQUE: Multiplanar, multisequence  MR imaging of the lumbar spine was performed. No intravenous contrast was administered. COMPARISON:  05/20/2018 lumbar spine MRI. FINDINGS: Segmentation:  Standard. Alignment:  Stable L4-5 and L5-S1 grade 1 anterolisthesis. Vertebrae: No fracture, evidence of discitis, or bone lesion. Mild interspinous edema at the L2-3 and L3-4 levels compatible with Baastrup's disease. Conus medullaris and cauda equina: Conus extends to the L1 level. Conus and cauda equina appear normal. Paraspinal and other soft tissues: Negative. Disc levels: L1-2: Small disc bulge and mild facet hypertrophy. No significant foraminal or canal stenosis. L2-3: Diffuse disc bulge with facet and ligamentum flavum hypertrophy results in mild bilateral foraminal stenosis. New central disc protrusion with severe canal stenosis and impingement of cauda equina (series 6, image 11). L3-4: Small disc bulge with  facet and ligamentum flavum hypertrophy. Mild bilateral foraminal stenosis and moderate canal stenosis. L4-5: Anterolisthesis with small uncovered disc bulge and moderate bilateral facet hypertrophy. No significant foraminal or canal stenosis. L5-S1: Anterolisthesis with small uncovered disc bulge and moderate facet hypertrophy. No significant foraminal or canal stenosis. IMPRESSION: 1. Interval progression of L2-3 central disc protrusion with severe canal stenosis and impingement of cauda equina. 2. No acute fracture. 3. Stable grade 1 anterolisthesis at L4-5 and L5-S1. 4. Stable lumbar spondylosis at other levels with multilevel mild foraminal stenosis and moderate L3-4 canal stenosis. Electronically Signed   By: Kristine Garbe M.D.   On: 06/17/2018 22:12    Microbiology: No results found for this or any previous visit (from the past 240 hour(s)).   Labs: Basic Metabolic Panel: Recent Labs  Lab 06/17/18 0503 06/18/18 0438  NA 137 140  K 4.5 4.7  CL 106 105  CO2 23 28  GLUCOSE 114* 112*  BUN 13 16  CREATININE 0.83 0.96  CALCIUM 8.4* 9.1   Liver Function Tests: No results for input(s): AST, ALT, ALKPHOS, BILITOT, PROT, ALBUMIN in the last 168 hours. No results for input(s): LIPASE, AMYLASE in the last 168 hours. No results for input(s): AMMONIA in the last 168 hours. CBC: Recent Labs  Lab 06/17/18 0503  WBC 9.0  NEUTROABS 5.7  HGB 11.3*  HCT 36.2  MCV 93.1  PLT 275   Cardiac Enzymes: No results for input(s): CKTOTAL, CKMB, CKMBINDEX, TROPONINI in the last 168 hours. BNP: BNP (last 3 results) No results for input(s): BNP in the last 8760 hours.  ProBNP (last 3 results) No results for input(s): PROBNP in the last 8760 hours.  CBG: No results for input(s): GLUCAP in the last 168 hours.     Signed:  Domenic Polite MD.  [facility] 06/18/2018, 1:28 PM

## 2018-06-18 NOTE — Consult Note (Signed)
Reason for Consult:sever back and right leg pain Referring Physician: Renalda Locklin is an 67 y.o. female.   HPI: 67 year old with past medical history relevant for class II obesity, hypertension, nonobstructive coronary artery disease, lumbago, anxiety, depression who comes in with acute on chronic back pain.  She describes in the past primarily left-sided radicular symptoms however it apparently has switched now to her right.  She was seen at [facility] approximately 1 month ago which showed worsening stenosis with impingement on L2/3 and L3/4 and with spondylolisthesis L 45 and L 5 S 1and she was discharged.  Since then she is continued to have worsening right-sided lower radicular symptoms including increasing weakness, pain, numbness, tingling.  She has had multiple falls due to this.  She has required a walker for the past day and the pain has been so severe that she is unable to bear weight on lower extremities.  She is completed to oral steroid tapers without much improvement.  In the ER her labs were unremarkable.  She was trialed on pain control.  She is admitted for PT/OT, possible neurosurgical evaluation, pain control.  She has not had any bladder or bowel incontinence or any saddle anesthesia.  Verdis Prime, RN Neurosurgery assessed patient yesterday with me and, based on the significant change in patient's symptoms, we recommended a new lumbar MRI be obtained.  This shows a new acute disc herniation right > left with severe spinal stenosis at the L 23 level.  The other, chronic degenerative changes are unchanged.  Today, the patient is feeling 25 % better and feels that with the medications and walker, she is able to get around and her pain has improved somewhat.   Past Medical History:  Diagnosis Date  . Anxiety   . Arthritis    "hands, maybe some in my knees and back" (06/17/2018)  . CAD (nonobstructive, mild LAD per cath 2017)   . Chronic left hip pain   . Chronic upper back pain    . Depression   . GERD (gastroesophageal reflux disease)   . Hypercholesteremia   . Hypertension   . Low back pain radiating to lower extremity    "effects BLE; right side is extreme right now" (06/17/2018)  . Melanoma ([place])    "cut off my face"  . Obesity, Class II, BMI 35-39.9     Past Surgical History:  Procedure Laterality Date  . BACK SURGERY    . CESAREAN SECTION  1989  . DILATION AND CURETTAGE OF UTERUS    . MELANOMA EXCISION     "face"  . OOPHORECTOMY    . POSTERIOR FUSION CERVICAL SPINE  ~ 1997  . TUBAL LIGATION      Family History  Problem Relation Age of Onset  . Diabetes Mother   . Heart attack Father   . Diabetes Sister   . Diabetes Sister   . Diabetes Brother   . Hyperlipidemia Sister   . Hyperlipidemia Sister   . Hyperlipidemia Brother     Social History:  reports that she has never smoked. She has never used smokeless tobacco. She reports that she drinks alcohol. She reports that she does not use drugs.  Allergies:  Allergies  Allergen Reactions  . Wasp Venom Anaphylaxis    Medications: I have reviewed the patient's current medications.  Results for orders placed or performed during the hospital encounter of 06/17/18 (from the past 48 hour(s))  Basic metabolic panel     Status: Abnormal  Collection Time: 06/17/18  5:03 AM  Result Value Ref Range   Sodium 137 135 - 145 mmol/L   Potassium 4.5 3.5 - 5.1 mmol/L   Chloride 106 98 - 111 mmol/L    Comment: Please note change in reference range.   CO2 23 22 - 32 mmol/L   Glucose, Bld 114 (H) 70 - 99 mg/dL    Comment: Please note change in reference range.   BUN 13 8 - 23 mg/dL    Comment: Please note change in reference range.   Creatinine, Ser 0.83 0.44 - 1.00 mg/dL   Calcium 8.4 (L) 8.9 - 10.3 mg/dL   GFR calc non Af Amer >60 >60 mL/min   GFR calc Af Amer >60 >60 mL/min    Comment: (NOTE) The eGFR has been calculated using the CKD EPI equation. This calculation has not been validated in all  clinical situations. eGFR's persistently <60 mL/min signify possible Chronic Kidney Disease.    Anion gap 8 5 - 15    Comment: Performed at [facility] [address], [place], [place] [address]  CBC with Differential/Platelet     Status: Abnormal   Collection Time: 06/17/18  5:03 AM  Result Value Ref Range   WBC 9.0 4.0 - 10.5 K/uL   RBC 3.89 3.87 - 5.11 MIL/uL   Hemoglobin 11.3 (L) 12.0 - 15.0 g/dL   HCT 36.2 36.0 - 46.0 %   MCV 93.1 78.0 - 100.0 fL   MCH 29.0 26.0 - 34.0 pg   MCHC 31.2 30.0 - 36.0 g/dL   RDW 14.8 11.5 - 15.5 %   Platelets 275 150 - 400 K/uL   Neutrophils Relative % 62 %   Neutro Abs 5.7 1.7 - 7.7 K/uL   Lymphocytes Relative 23 %   Lymphs Abs 2.1 0.7 - 4.0 K/uL   Monocytes Relative 7 %   Monocytes Absolute 0.6 0.1 - 1.0 K/uL   Eosinophils Relative 6 %   Eosinophils Absolute 0.5 0.0 - 0.7 K/uL   Basophils Relative 1 %   Basophils Absolute 0.1 0.0 - 0.1 K/uL   Immature Granulocytes 1 %   Abs Immature Granulocytes 0.1 0.0 - 0.1 K/uL    Comment: Performed at [facility] [address] [place], [place] [address]  HIV antibody (Routine Testing)     Status: None   Collection Time: 06/17/18  8:18 AM  Result Value Ref Range   HIV Screen 4th Generation wRfx Non Reactive Non Reactive    Comment: (NOTE) Performed At: [facility] [address] [place], [place] [id number] Rush Farmer MD [id number] Performed at [facility], [place] [address] [place], [place] [address]   Basic metabolic panel     Status: Abnormal   Collection Time: 06/18/18  4:38 AM  Result Value Ref Range   Sodium 140 135 - 145 mmol/L   Potassium 4.7 3.5 - 5.1 mmol/L   Chloride 105 98 - 111 mmol/L    Comment: Please note change in reference range.   CO2 28 22 - 32 mmol/L   Glucose, Bld 112 (H) 70 - 99 mg/dL    Comment: Please note change in reference range.   BUN 16 8 - 23 mg/dL    Comment: Please note change in reference range.   Creatinine, Ser 0.96  0.44 - 1.00 mg/dL   Calcium 9.1 8.9 - 10.3 mg/dL   GFR calc non Af Amer >60 >60 mL/min   GFR calc Af Amer >  60 >60 mL/min    Comment: (NOTE) The eGFR has been calculated using the CKD EPI equation. This calculation has not been validated in all clinical situations. eGFR's persistently <60 mL/min signify possible Chronic Kidney Disease.    Anion gap 7 5 - 15    Comment: Performed at [facility] [address] [place], [place] [address]    Dg Lumbar Spine Complete  Result Date: 06/17/2018 CLINICAL DATA:  Back pain. Patient barely able to stand for flexion and extension views. EXAM: LUMBAR SPINE - COMPLETE 4+ VIEW COMPARISON:  May 29, 2018 FINDINGS: Hernia mesh noted.  Other soft tissues are unremarkable. No inter pedicular widening identified. Grade 1 anterolisthesis of L4 versus L5 measures 6.4 mm in the recumbent position, 6.6 mm in flexion and 6.6 mm with extension. No significant instability with flexion or extension at L4-5. Grade 1 anterolisthesis of L5 versus S1 is only seen when the patient is in the recumbent position measuring 3.6 mm. This is not visualized on flexion or extension views. No other malalignment. No acute fractures. Mild multilevel degenerative changes. Lower lumbar facet degenerative changes. IMPRESSION: 1. Anterolisthesis of L4 versus L5 is grade 1 and stable on flexion and extension views. 2. 3.6 mm of mild grade 1 anterolisthesis of L5 versus S1 is only seen on recumbent images, and not seen on flexion or extension views. Electronically Signed   By: Dorise Bullion III M.D   On: 06/17/2018 13:40   Mr Lumbar Spine Wo Contrast  Result Date: 06/17/2018 CLINICAL DATA:  67 y/o F; acute on chronic lower back pain with right-sided radicular symptoms. Multiple falls. EXAM: MRI LUMBAR SPINE WITHOUT CONTRAST TECHNIQUE: Multiplanar, multisequence MR imaging of the lumbar spine was performed. No intravenous contrast was administered. COMPARISON:  05/20/2018 lumbar spine MRI.  FINDINGS: Segmentation:  Standard. Alignment:  Stable L4-5 and L5-S1 grade 1 anterolisthesis. Vertebrae: No fracture, evidence of discitis, or bone lesion. Mild interspinous edema at the L2-3 and L3-4 levels compatible with Baastrup's disease. Conus medullaris and cauda equina: Conus extends to the L1 level. Conus and cauda equina appear normal. Paraspinal and other soft tissues: Negative. Disc levels: L1-2: Small disc bulge and mild facet hypertrophy. No significant foraminal or canal stenosis. L2-3: Diffuse disc bulge with facet and ligamentum flavum hypertrophy results in mild bilateral foraminal stenosis. New central disc protrusion with severe canal stenosis and impingement of cauda equina (series 6, image 11). L3-4: Small disc bulge with facet and ligamentum flavum hypertrophy. Mild bilateral foraminal stenosis and moderate canal stenosis. L4-5: Anterolisthesis with small uncovered disc bulge and moderate bilateral facet hypertrophy. No significant foraminal or canal stenosis. L5-S1: Anterolisthesis with small uncovered disc bulge and moderate facet hypertrophy. No significant foraminal or canal stenosis. IMPRESSION: 1. Interval progression of L2-3 central disc protrusion with severe canal stenosis and impingement of cauda equina. 2. No acute fracture. 3. Stable grade 1 anterolisthesis at L4-5 and L5-S1. 4. Stable lumbar spondylosis at other levels with multilevel mild foraminal stenosis and moderate L3-4 canal stenosis. Electronically Signed   By: Kristine Garbe M.D.   On: 06/17/2018 22:12    Review of Systems - Negative except per HPI    Blood pressure 140/71, pulse 85, temperature (!) 97.5 F (36.4 C), temperature source Oral, resp. rate 20, height 5' (1.524 m), weight 88.8 kg (195 lb 12.3 oz), SpO2 96 %. Physical Exam  Constitutional: She is oriented to person, place, and time. She appears well-developed and well-nourished.  Obese  HENT:  Head: Normocephalic  and atraumatic.  Eyes:  Pupils are equal, round, and reactive to light. EOM are normal.  Neck: Normal range of motion. Neck supple.  Neurological: She is alert and oriented to person, place, and time. She has normal reflexes. No cranial nerve deficit or sensory deficit. GCS eye subscore is 4. GCS verbal subscore is 5. GCS motor subscore is 6.  Hip flexor 4/5 right, numbness in right thigh.  Positive SLR 20 degrees on the right    Assessment/Plan:  Patient has acute on chronic low back pain with new disc herniation L 23 on the right with severe spinal stenosis at this level.  She is a bit better with PT.  Patient would like to go home with the walker and medications.  I have advised her that surgery is going to likely be necessary, given the large size of the disc herniation, her weakness, immobility, and the intensity of her pain.  I have suggested that she complete PT today and that if her mobility improves, she is discharged home.  I would like for her to follow up with me in my office early next week.  If she continues to improve, we can hold off on surgery, but more likely, if her pain and weakness persist, she with need to undergo an L 23 right (possibly bilateral) microdiscectomy in the near future.     Peggyann Shoals, MD 06/18/2018, 8:18 AM

## 2018-06-18 NOTE — Progress Notes (Signed)
Pt discharge education and instructions completed with pt and family at bedside; all voices understanding and denies any questions. Pt IV removed; and pt handed her prescriptions for percocet; senna and Lidoderm patch. Pt discharge home with family to transport him home. Pt transported off unit via wheelchair with belongings and family to the side. Delia Heady RN

## 2018-06-18 NOTE — Care Management Obs Status (Signed)
[facility] NOTIFICATION   Patient Details  Name: Rebecca Reynolds MRN: [id number] Date of Birth: Jul 02, 1951   Medicare Observation Status Notification Given:  Yes    Carles Collet, RN 06/18/2018, 10:19 AM

## 2018-06-24 DIAGNOSIS — K219 Gastro-esophageal reflux disease without esophagitis: Secondary | ICD-10-CM | POA: Diagnosis not present

## 2018-06-24 DIAGNOSIS — M545 Low back pain: Secondary | ICD-10-CM | POA: Diagnosis not present

## 2018-06-24 DIAGNOSIS — E7800 Pure hypercholesterolemia, unspecified: Secondary | ICD-10-CM | POA: Diagnosis not present

## 2018-06-24 DIAGNOSIS — I10 Essential (primary) hypertension: Secondary | ICD-10-CM | POA: Diagnosis not present

## 2018-06-24 DIAGNOSIS — G894 Chronic pain syndrome: Secondary | ICD-10-CM | POA: Diagnosis not present

## 2018-06-24 DIAGNOSIS — Z9181 History of falling: Secondary | ICD-10-CM | POA: Diagnosis not present

## 2018-06-24 DIAGNOSIS — I2510 Atherosclerotic heart disease of native coronary artery without angina pectoris: Secondary | ICD-10-CM | POA: Diagnosis not present

## 2018-06-24 DIAGNOSIS — E669 Obesity, unspecified: Secondary | ICD-10-CM | POA: Diagnosis not present

## 2018-06-24 DIAGNOSIS — M4316 Spondylolisthesis, lumbar region: Secondary | ICD-10-CM | POA: Diagnosis not present

## 2018-06-29 DIAGNOSIS — I2510 Atherosclerotic heart disease of native coronary artery without angina pectoris: Secondary | ICD-10-CM | POA: Diagnosis not present

## 2018-06-29 DIAGNOSIS — E669 Obesity, unspecified: Secondary | ICD-10-CM | POA: Diagnosis not present

## 2018-06-29 DIAGNOSIS — G894 Chronic pain syndrome: Secondary | ICD-10-CM | POA: Diagnosis not present

## 2018-06-29 DIAGNOSIS — E7800 Pure hypercholesterolemia, unspecified: Secondary | ICD-10-CM | POA: Diagnosis not present

## 2018-06-29 DIAGNOSIS — M4316 Spondylolisthesis, lumbar region: Secondary | ICD-10-CM | POA: Diagnosis not present

## 2018-06-29 DIAGNOSIS — I10 Essential (primary) hypertension: Secondary | ICD-10-CM | POA: Diagnosis not present

## 2018-06-29 DIAGNOSIS — K219 Gastro-esophageal reflux disease without esophagitis: Secondary | ICD-10-CM | POA: Diagnosis not present

## 2018-06-29 DIAGNOSIS — Z9181 History of falling: Secondary | ICD-10-CM | POA: Diagnosis not present

## 2018-06-29 DIAGNOSIS — M545 Low back pain: Secondary | ICD-10-CM | POA: Diagnosis not present

## 2018-06-30 DIAGNOSIS — Z9181 History of falling: Secondary | ICD-10-CM | POA: Diagnosis not present

## 2018-06-30 DIAGNOSIS — E7800 Pure hypercholesterolemia, unspecified: Secondary | ICD-10-CM | POA: Diagnosis not present

## 2018-06-30 DIAGNOSIS — I2510 Atherosclerotic heart disease of native coronary artery without angina pectoris: Secondary | ICD-10-CM | POA: Diagnosis not present

## 2018-06-30 DIAGNOSIS — M4316 Spondylolisthesis, lumbar region: Secondary | ICD-10-CM | POA: Diagnosis not present

## 2018-06-30 DIAGNOSIS — G894 Chronic pain syndrome: Secondary | ICD-10-CM | POA: Diagnosis not present

## 2018-06-30 DIAGNOSIS — M545 Low back pain: Secondary | ICD-10-CM | POA: Diagnosis not present

## 2018-06-30 DIAGNOSIS — I10 Essential (primary) hypertension: Secondary | ICD-10-CM | POA: Diagnosis not present

## 2018-06-30 DIAGNOSIS — K219 Gastro-esophageal reflux disease without esophagitis: Secondary | ICD-10-CM | POA: Diagnosis not present

## 2018-06-30 DIAGNOSIS — E669 Obesity, unspecified: Secondary | ICD-10-CM | POA: Diagnosis not present

## 2018-07-01 DIAGNOSIS — R945 Abnormal results of liver function studies: Secondary | ICD-10-CM | POA: Diagnosis not present

## 2018-07-01 DIAGNOSIS — M5126 Other intervertebral disc displacement, lumbar region: Secondary | ICD-10-CM | POA: Insufficient documentation

## 2018-07-01 DIAGNOSIS — E8881 Metabolic syndrome: Secondary | ICD-10-CM | POA: Diagnosis not present

## 2018-07-01 DIAGNOSIS — K219 Gastro-esophageal reflux disease without esophagitis: Secondary | ICD-10-CM | POA: Diagnosis not present

## 2018-07-01 DIAGNOSIS — E7800 Pure hypercholesterolemia, unspecified: Secondary | ICD-10-CM | POA: Diagnosis not present

## 2018-07-01 DIAGNOSIS — J208 Acute bronchitis due to other specified organisms: Secondary | ICD-10-CM | POA: Diagnosis not present

## 2018-07-01 DIAGNOSIS — K5909 Other constipation: Secondary | ICD-10-CM | POA: Diagnosis not present

## 2018-07-01 DIAGNOSIS — I10 Essential (primary) hypertension: Secondary | ICD-10-CM | POA: Diagnosis not present

## 2018-07-01 DIAGNOSIS — F419 Anxiety disorder, unspecified: Secondary | ICD-10-CM | POA: Diagnosis not present

## 2018-07-01 DIAGNOSIS — M542 Cervicalgia: Secondary | ICD-10-CM | POA: Diagnosis not present

## 2018-07-12 DIAGNOSIS — G894 Chronic pain syndrome: Secondary | ICD-10-CM | POA: Diagnosis not present

## 2018-07-12 DIAGNOSIS — M545 Low back pain: Secondary | ICD-10-CM | POA: Diagnosis not present

## 2018-07-12 DIAGNOSIS — M5116 Intervertebral disc disorders with radiculopathy, lumbar region: Secondary | ICD-10-CM | POA: Diagnosis not present

## 2018-07-12 DIAGNOSIS — M5126 Other intervertebral disc displacement, lumbar region: Secondary | ICD-10-CM | POA: Diagnosis not present

## 2018-07-29 DIAGNOSIS — M542 Cervicalgia: Secondary | ICD-10-CM | POA: Diagnosis not present

## 2018-07-29 DIAGNOSIS — F419 Anxiety disorder, unspecified: Secondary | ICD-10-CM | POA: Diagnosis not present

## 2018-07-29 DIAGNOSIS — E8881 Metabolic syndrome: Secondary | ICD-10-CM | POA: Diagnosis not present

## 2018-07-29 DIAGNOSIS — K219 Gastro-esophageal reflux disease without esophagitis: Secondary | ICD-10-CM | POA: Diagnosis not present

## 2018-07-29 DIAGNOSIS — M549 Dorsalgia, unspecified: Secondary | ICD-10-CM | POA: Diagnosis not present

## 2018-07-29 DIAGNOSIS — R300 Dysuria: Secondary | ICD-10-CM | POA: Diagnosis not present

## 2018-07-29 DIAGNOSIS — R945 Abnormal results of liver function studies: Secondary | ICD-10-CM | POA: Diagnosis not present

## 2018-07-29 DIAGNOSIS — K5909 Other constipation: Secondary | ICD-10-CM | POA: Diagnosis not present

## 2018-07-29 DIAGNOSIS — E7800 Pure hypercholesterolemia, unspecified: Secondary | ICD-10-CM | POA: Diagnosis not present

## 2018-08-26 DIAGNOSIS — R945 Abnormal results of liver function studies: Secondary | ICD-10-CM | POA: Diagnosis not present

## 2018-08-26 DIAGNOSIS — M549 Dorsalgia, unspecified: Secondary | ICD-10-CM | POA: Diagnosis not present

## 2018-08-26 DIAGNOSIS — E8881 Metabolic syndrome: Secondary | ICD-10-CM | POA: Diagnosis not present

## 2018-08-26 DIAGNOSIS — R300 Dysuria: Secondary | ICD-10-CM | POA: Diagnosis not present

## 2018-08-26 DIAGNOSIS — M542 Cervicalgia: Secondary | ICD-10-CM | POA: Diagnosis not present

## 2018-08-26 DIAGNOSIS — K5909 Other constipation: Secondary | ICD-10-CM | POA: Diagnosis not present

## 2018-08-26 DIAGNOSIS — K219 Gastro-esophageal reflux disease without esophagitis: Secondary | ICD-10-CM | POA: Diagnosis not present

## 2018-08-26 DIAGNOSIS — F419 Anxiety disorder, unspecified: Secondary | ICD-10-CM | POA: Diagnosis not present

## 2018-08-26 DIAGNOSIS — E7800 Pure hypercholesterolemia, unspecified: Secondary | ICD-10-CM | POA: Diagnosis not present

## 2018-09-22 DIAGNOSIS — E7800 Pure hypercholesterolemia, unspecified: Secondary | ICD-10-CM | POA: Diagnosis not present

## 2018-09-22 DIAGNOSIS — F3341 Major depressive disorder, recurrent, in partial remission: Secondary | ICD-10-CM | POA: Diagnosis not present

## 2018-09-22 DIAGNOSIS — M549 Dorsalgia, unspecified: Secondary | ICD-10-CM | POA: Diagnosis not present

## 2018-09-22 DIAGNOSIS — Z1339 Encounter for screening examination for other mental health and behavioral disorders: Secondary | ICD-10-CM | POA: Diagnosis not present

## 2018-09-22 DIAGNOSIS — Z23 Encounter for immunization: Secondary | ICD-10-CM | POA: Diagnosis not present

## 2018-09-22 DIAGNOSIS — K5909 Other constipation: Secondary | ICD-10-CM | POA: Diagnosis not present

## 2018-09-22 DIAGNOSIS — E8881 Metabolic syndrome: Secondary | ICD-10-CM | POA: Diagnosis not present

## 2018-09-22 DIAGNOSIS — K219 Gastro-esophageal reflux disease without esophagitis: Secondary | ICD-10-CM | POA: Diagnosis not present

## 2018-09-22 DIAGNOSIS — M542 Cervicalgia: Secondary | ICD-10-CM | POA: Diagnosis not present

## 2018-09-22 DIAGNOSIS — I10 Essential (primary) hypertension: Secondary | ICD-10-CM | POA: Diagnosis not present

## 2018-09-22 DIAGNOSIS — F419 Anxiety disorder, unspecified: Secondary | ICD-10-CM | POA: Diagnosis not present

## 2018-10-20 DIAGNOSIS — E8881 Metabolic syndrome: Secondary | ICD-10-CM | POA: Diagnosis not present

## 2018-10-20 DIAGNOSIS — M549 Dorsalgia, unspecified: Secondary | ICD-10-CM | POA: Diagnosis not present

## 2018-10-20 DIAGNOSIS — F419 Anxiety disorder, unspecified: Secondary | ICD-10-CM | POA: Diagnosis not present

## 2018-10-20 DIAGNOSIS — K219 Gastro-esophageal reflux disease without esophagitis: Secondary | ICD-10-CM | POA: Diagnosis not present

## 2018-10-20 DIAGNOSIS — E7800 Pure hypercholesterolemia, unspecified: Secondary | ICD-10-CM | POA: Diagnosis not present

## 2018-10-20 DIAGNOSIS — M542 Cervicalgia: Secondary | ICD-10-CM | POA: Diagnosis not present

## 2018-10-20 DIAGNOSIS — K5909 Other constipation: Secondary | ICD-10-CM | POA: Diagnosis not present

## 2018-10-20 DIAGNOSIS — R945 Abnormal results of liver function studies: Secondary | ICD-10-CM | POA: Diagnosis not present

## 2018-10-20 DIAGNOSIS — G4700 Insomnia, unspecified: Secondary | ICD-10-CM | POA: Diagnosis not present

## 2018-11-10 DIAGNOSIS — G4700 Insomnia, unspecified: Secondary | ICD-10-CM | POA: Diagnosis not present

## 2018-11-10 DIAGNOSIS — M549 Dorsalgia, unspecified: Secondary | ICD-10-CM | POA: Diagnosis not present

## 2018-11-10 DIAGNOSIS — K5909 Other constipation: Secondary | ICD-10-CM | POA: Diagnosis not present

## 2018-11-10 DIAGNOSIS — K219 Gastro-esophageal reflux disease without esophagitis: Secondary | ICD-10-CM | POA: Diagnosis not present

## 2018-11-10 DIAGNOSIS — M542 Cervicalgia: Secondary | ICD-10-CM | POA: Diagnosis not present

## 2018-11-10 DIAGNOSIS — E7800 Pure hypercholesterolemia, unspecified: Secondary | ICD-10-CM | POA: Diagnosis not present

## 2018-11-10 DIAGNOSIS — E8881 Metabolic syndrome: Secondary | ICD-10-CM | POA: Diagnosis not present

## 2018-11-10 DIAGNOSIS — R945 Abnormal results of liver function studies: Secondary | ICD-10-CM | POA: Diagnosis not present

## 2018-11-10 DIAGNOSIS — I10 Essential (primary) hypertension: Secondary | ICD-10-CM | POA: Diagnosis not present

## 2018-12-10 DIAGNOSIS — J208 Acute bronchitis due to other specified organisms: Secondary | ICD-10-CM | POA: Diagnosis not present

## 2018-12-10 DIAGNOSIS — M542 Cervicalgia: Secondary | ICD-10-CM | POA: Diagnosis not present

## 2018-12-10 DIAGNOSIS — M549 Dorsalgia, unspecified: Secondary | ICD-10-CM | POA: Diagnosis not present

## 2018-12-10 DIAGNOSIS — G4700 Insomnia, unspecified: Secondary | ICD-10-CM | POA: Diagnosis not present

## 2018-12-10 DIAGNOSIS — K5909 Other constipation: Secondary | ICD-10-CM | POA: Diagnosis not present

## 2018-12-10 DIAGNOSIS — E7800 Pure hypercholesterolemia, unspecified: Secondary | ICD-10-CM | POA: Diagnosis not present

## 2018-12-10 DIAGNOSIS — K219 Gastro-esophageal reflux disease without esophagitis: Secondary | ICD-10-CM | POA: Diagnosis not present

## 2018-12-10 DIAGNOSIS — E8881 Metabolic syndrome: Secondary | ICD-10-CM | POA: Diagnosis not present

## 2018-12-10 DIAGNOSIS — R945 Abnormal results of liver function studies: Secondary | ICD-10-CM | POA: Diagnosis not present

## 2019-01-10 DIAGNOSIS — M542 Cervicalgia: Secondary | ICD-10-CM | POA: Diagnosis not present

## 2019-01-10 DIAGNOSIS — E8881 Metabolic syndrome: Secondary | ICD-10-CM | POA: Diagnosis not present

## 2019-01-10 DIAGNOSIS — E7800 Pure hypercholesterolemia, unspecified: Secondary | ICD-10-CM | POA: Diagnosis not present

## 2019-01-10 DIAGNOSIS — M549 Dorsalgia, unspecified: Secondary | ICD-10-CM | POA: Diagnosis not present

## 2019-01-10 DIAGNOSIS — K5909 Other constipation: Secondary | ICD-10-CM | POA: Diagnosis not present

## 2019-01-10 DIAGNOSIS — J069 Acute upper respiratory infection, unspecified: Secondary | ICD-10-CM | POA: Diagnosis not present

## 2019-01-10 DIAGNOSIS — K219 Gastro-esophageal reflux disease without esophagitis: Secondary | ICD-10-CM | POA: Diagnosis not present

## 2019-01-10 DIAGNOSIS — R945 Abnormal results of liver function studies: Secondary | ICD-10-CM | POA: Diagnosis not present

## 2019-01-10 DIAGNOSIS — G4700 Insomnia, unspecified: Secondary | ICD-10-CM | POA: Diagnosis not present

## 2019-01-24 DIAGNOSIS — E8881 Metabolic syndrome: Secondary | ICD-10-CM | POA: Diagnosis not present

## 2019-01-24 DIAGNOSIS — F3341 Major depressive disorder, recurrent, in partial remission: Secondary | ICD-10-CM | POA: Diagnosis not present

## 2019-01-24 DIAGNOSIS — I10 Essential (primary) hypertension: Secondary | ICD-10-CM | POA: Diagnosis not present

## 2019-01-24 DIAGNOSIS — M549 Dorsalgia, unspecified: Secondary | ICD-10-CM | POA: Diagnosis not present

## 2019-01-24 DIAGNOSIS — R6889 Other general symptoms and signs: Secondary | ICD-10-CM | POA: Diagnosis not present

## 2019-01-24 DIAGNOSIS — R945 Abnormal results of liver function studies: Secondary | ICD-10-CM | POA: Diagnosis not present

## 2019-01-24 DIAGNOSIS — K5909 Other constipation: Secondary | ICD-10-CM | POA: Diagnosis not present

## 2019-01-24 DIAGNOSIS — E7800 Pure hypercholesterolemia, unspecified: Secondary | ICD-10-CM | POA: Diagnosis not present

## 2019-01-24 DIAGNOSIS — K219 Gastro-esophageal reflux disease without esophagitis: Secondary | ICD-10-CM | POA: Diagnosis not present

## 2019-02-07 DIAGNOSIS — E8881 Metabolic syndrome: Secondary | ICD-10-CM | POA: Diagnosis not present

## 2019-02-07 DIAGNOSIS — Z1331 Encounter for screening for depression: Secondary | ICD-10-CM | POA: Diagnosis not present

## 2019-02-07 DIAGNOSIS — K219 Gastro-esophageal reflux disease without esophagitis: Secondary | ICD-10-CM | POA: Diagnosis not present

## 2019-02-07 DIAGNOSIS — G4700 Insomnia, unspecified: Secondary | ICD-10-CM | POA: Diagnosis not present

## 2019-02-07 DIAGNOSIS — K5909 Other constipation: Secondary | ICD-10-CM | POA: Diagnosis not present

## 2019-02-07 DIAGNOSIS — M542 Cervicalgia: Secondary | ICD-10-CM | POA: Diagnosis not present

## 2019-02-07 DIAGNOSIS — M549 Dorsalgia, unspecified: Secondary | ICD-10-CM | POA: Diagnosis not present

## 2019-02-07 DIAGNOSIS — R945 Abnormal results of liver function studies: Secondary | ICD-10-CM | POA: Diagnosis not present

## 2019-02-07 DIAGNOSIS — E7800 Pure hypercholesterolemia, unspecified: Secondary | ICD-10-CM | POA: Diagnosis not present

## 2019-03-04 DIAGNOSIS — M549 Dorsalgia, unspecified: Secondary | ICD-10-CM | POA: Diagnosis not present

## 2019-03-04 DIAGNOSIS — E8881 Metabolic syndrome: Secondary | ICD-10-CM | POA: Diagnosis not present

## 2019-03-04 DIAGNOSIS — K5909 Other constipation: Secondary | ICD-10-CM | POA: Diagnosis not present

## 2019-03-04 DIAGNOSIS — E7800 Pure hypercholesterolemia, unspecified: Secondary | ICD-10-CM | POA: Diagnosis not present

## 2019-03-04 DIAGNOSIS — R945 Abnormal results of liver function studies: Secondary | ICD-10-CM | POA: Diagnosis not present

## 2019-03-04 DIAGNOSIS — K219 Gastro-esophageal reflux disease without esophagitis: Secondary | ICD-10-CM | POA: Diagnosis not present

## 2019-03-04 DIAGNOSIS — J069 Acute upper respiratory infection, unspecified: Secondary | ICD-10-CM | POA: Diagnosis not present

## 2019-03-04 DIAGNOSIS — M542 Cervicalgia: Secondary | ICD-10-CM | POA: Diagnosis not present

## 2019-03-04 DIAGNOSIS — G4700 Insomnia, unspecified: Secondary | ICD-10-CM | POA: Diagnosis not present

## 2019-03-31 DIAGNOSIS — E7800 Pure hypercholesterolemia, unspecified: Secondary | ICD-10-CM | POA: Diagnosis not present

## 2019-03-31 DIAGNOSIS — R945 Abnormal results of liver function studies: Secondary | ICD-10-CM | POA: Diagnosis not present

## 2019-03-31 DIAGNOSIS — M542 Cervicalgia: Secondary | ICD-10-CM | POA: Diagnosis not present

## 2019-03-31 DIAGNOSIS — K219 Gastro-esophageal reflux disease without esophagitis: Secondary | ICD-10-CM | POA: Diagnosis not present

## 2019-03-31 DIAGNOSIS — I10 Essential (primary) hypertension: Secondary | ICD-10-CM | POA: Diagnosis not present

## 2019-03-31 DIAGNOSIS — E8881 Metabolic syndrome: Secondary | ICD-10-CM | POA: Diagnosis not present

## 2019-03-31 DIAGNOSIS — G4700 Insomnia, unspecified: Secondary | ICD-10-CM | POA: Diagnosis not present

## 2019-03-31 DIAGNOSIS — K5909 Other constipation: Secondary | ICD-10-CM | POA: Diagnosis not present

## 2019-03-31 DIAGNOSIS — M549 Dorsalgia, unspecified: Secondary | ICD-10-CM | POA: Diagnosis not present

## 2019-04-26 DIAGNOSIS — E8881 Metabolic syndrome: Secondary | ICD-10-CM | POA: Diagnosis not present

## 2019-04-26 DIAGNOSIS — K5909 Other constipation: Secondary | ICD-10-CM | POA: Diagnosis not present

## 2019-04-26 DIAGNOSIS — E7800 Pure hypercholesterolemia, unspecified: Secondary | ICD-10-CM | POA: Diagnosis not present

## 2019-04-26 DIAGNOSIS — R7989 Other specified abnormal findings of blood chemistry: Secondary | ICD-10-CM | POA: Diagnosis not present

## 2019-04-26 DIAGNOSIS — M549 Dorsalgia, unspecified: Secondary | ICD-10-CM | POA: Diagnosis not present

## 2019-04-26 DIAGNOSIS — Z9181 History of falling: Secondary | ICD-10-CM | POA: Diagnosis not present

## 2019-04-26 DIAGNOSIS — I10 Essential (primary) hypertension: Secondary | ICD-10-CM | POA: Diagnosis not present

## 2019-04-26 DIAGNOSIS — G4700 Insomnia, unspecified: Secondary | ICD-10-CM | POA: Diagnosis not present

## 2019-04-26 DIAGNOSIS — M542 Cervicalgia: Secondary | ICD-10-CM | POA: Diagnosis not present

## 2019-05-24 DIAGNOSIS — G4700 Insomnia, unspecified: Secondary | ICD-10-CM | POA: Diagnosis not present

## 2019-05-24 DIAGNOSIS — R7989 Other specified abnormal findings of blood chemistry: Secondary | ICD-10-CM | POA: Diagnosis not present

## 2019-05-24 DIAGNOSIS — K5909 Other constipation: Secondary | ICD-10-CM | POA: Diagnosis not present

## 2019-05-24 DIAGNOSIS — M542 Cervicalgia: Secondary | ICD-10-CM | POA: Diagnosis not present

## 2019-05-24 DIAGNOSIS — F3341 Major depressive disorder, recurrent, in partial remission: Secondary | ICD-10-CM | POA: Diagnosis not present

## 2019-05-24 DIAGNOSIS — E8881 Metabolic syndrome: Secondary | ICD-10-CM | POA: Diagnosis not present

## 2019-05-24 DIAGNOSIS — M549 Dorsalgia, unspecified: Secondary | ICD-10-CM | POA: Diagnosis not present

## 2019-05-24 DIAGNOSIS — F419 Anxiety disorder, unspecified: Secondary | ICD-10-CM | POA: Diagnosis not present

## 2019-05-24 DIAGNOSIS — G2581 Restless legs syndrome: Secondary | ICD-10-CM | POA: Diagnosis not present

## 2019-06-21 DIAGNOSIS — M542 Cervicalgia: Secondary | ICD-10-CM | POA: Diagnosis not present

## 2019-06-21 DIAGNOSIS — G4700 Insomnia, unspecified: Secondary | ICD-10-CM | POA: Diagnosis not present

## 2019-06-21 DIAGNOSIS — K5909 Other constipation: Secondary | ICD-10-CM | POA: Diagnosis not present

## 2019-06-21 DIAGNOSIS — R7989 Other specified abnormal findings of blood chemistry: Secondary | ICD-10-CM | POA: Diagnosis not present

## 2019-06-21 DIAGNOSIS — F3341 Major depressive disorder, recurrent, in partial remission: Secondary | ICD-10-CM | POA: Diagnosis not present

## 2019-06-21 DIAGNOSIS — M549 Dorsalgia, unspecified: Secondary | ICD-10-CM | POA: Diagnosis not present

## 2019-06-21 DIAGNOSIS — G2581 Restless legs syndrome: Secondary | ICD-10-CM | POA: Diagnosis not present

## 2019-06-21 DIAGNOSIS — Z139 Encounter for screening, unspecified: Secondary | ICD-10-CM | POA: Diagnosis not present

## 2019-06-21 DIAGNOSIS — E8881 Metabolic syndrome: Secondary | ICD-10-CM | POA: Diagnosis not present

## 2019-07-19 DIAGNOSIS — G8929 Other chronic pain: Secondary | ICD-10-CM | POA: Diagnosis not present

## 2019-07-19 DIAGNOSIS — E8881 Metabolic syndrome: Secondary | ICD-10-CM | POA: Diagnosis not present

## 2019-07-19 DIAGNOSIS — K5909 Other constipation: Secondary | ICD-10-CM | POA: Diagnosis not present

## 2019-07-19 DIAGNOSIS — R7989 Other specified abnormal findings of blood chemistry: Secondary | ICD-10-CM | POA: Diagnosis not present

## 2019-07-19 DIAGNOSIS — M549 Dorsalgia, unspecified: Secondary | ICD-10-CM | POA: Diagnosis not present

## 2019-07-19 DIAGNOSIS — F3341 Major depressive disorder, recurrent, in partial remission: Secondary | ICD-10-CM | POA: Diagnosis not present

## 2019-07-19 DIAGNOSIS — Z139 Encounter for screening, unspecified: Secondary | ICD-10-CM | POA: Diagnosis not present

## 2019-07-19 DIAGNOSIS — Z6835 Body mass index (BMI) 35.0-35.9, adult: Secondary | ICD-10-CM | POA: Diagnosis not present

## 2019-07-19 DIAGNOSIS — M542 Cervicalgia: Secondary | ICD-10-CM | POA: Diagnosis not present

## 2019-08-16 DIAGNOSIS — E8881 Metabolic syndrome: Secondary | ICD-10-CM | POA: Diagnosis not present

## 2019-08-16 DIAGNOSIS — R7989 Other specified abnormal findings of blood chemistry: Secondary | ICD-10-CM | POA: Diagnosis not present

## 2019-08-16 DIAGNOSIS — K5909 Other constipation: Secondary | ICD-10-CM | POA: Diagnosis not present

## 2019-08-16 DIAGNOSIS — F3341 Major depressive disorder, recurrent, in partial remission: Secondary | ICD-10-CM | POA: Diagnosis not present

## 2019-08-16 DIAGNOSIS — G4700 Insomnia, unspecified: Secondary | ICD-10-CM | POA: Diagnosis not present

## 2019-08-16 DIAGNOSIS — E7800 Pure hypercholesterolemia, unspecified: Secondary | ICD-10-CM | POA: Diagnosis not present

## 2019-08-16 DIAGNOSIS — M549 Dorsalgia, unspecified: Secondary | ICD-10-CM | POA: Diagnosis not present

## 2019-08-16 DIAGNOSIS — Z139 Encounter for screening, unspecified: Secondary | ICD-10-CM | POA: Diagnosis not present

## 2019-08-16 DIAGNOSIS — M542 Cervicalgia: Secondary | ICD-10-CM | POA: Diagnosis not present

## 2019-08-29 IMAGING — CR DG LUMBAR SPINE COMPLETE 4+V
4 series · 4 of 4 positions shown · non-contrast
Comparison: May 29, 2018

CLINICAL DATA: Back pain. Patient barely able to stand for flexion
and extension views.

EXAM:
LUMBAR SPINE - COMPLETE 4+ VIEW

[l-spine ap]
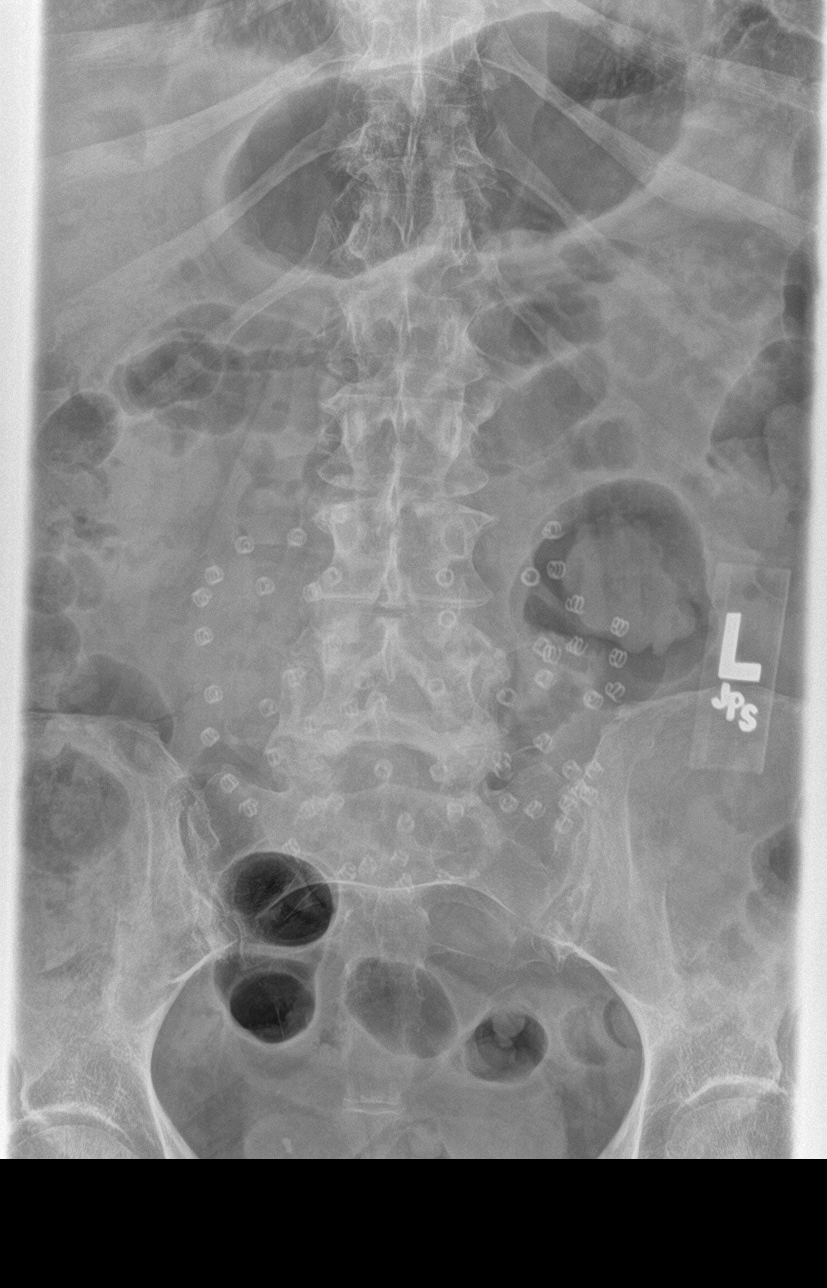

[l-spine lat (1 of 3)]
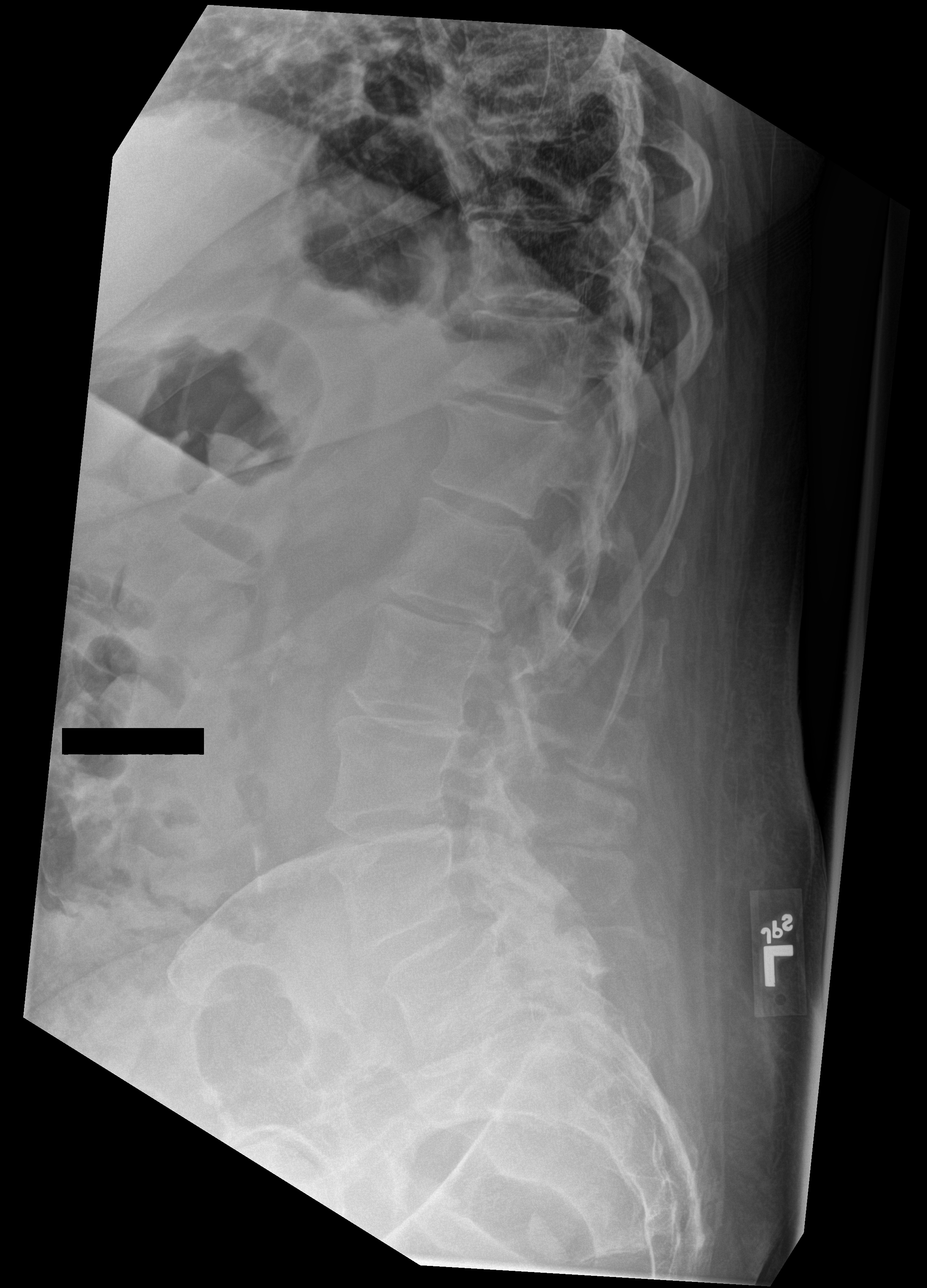

[l-spine lat (2 of 3)]
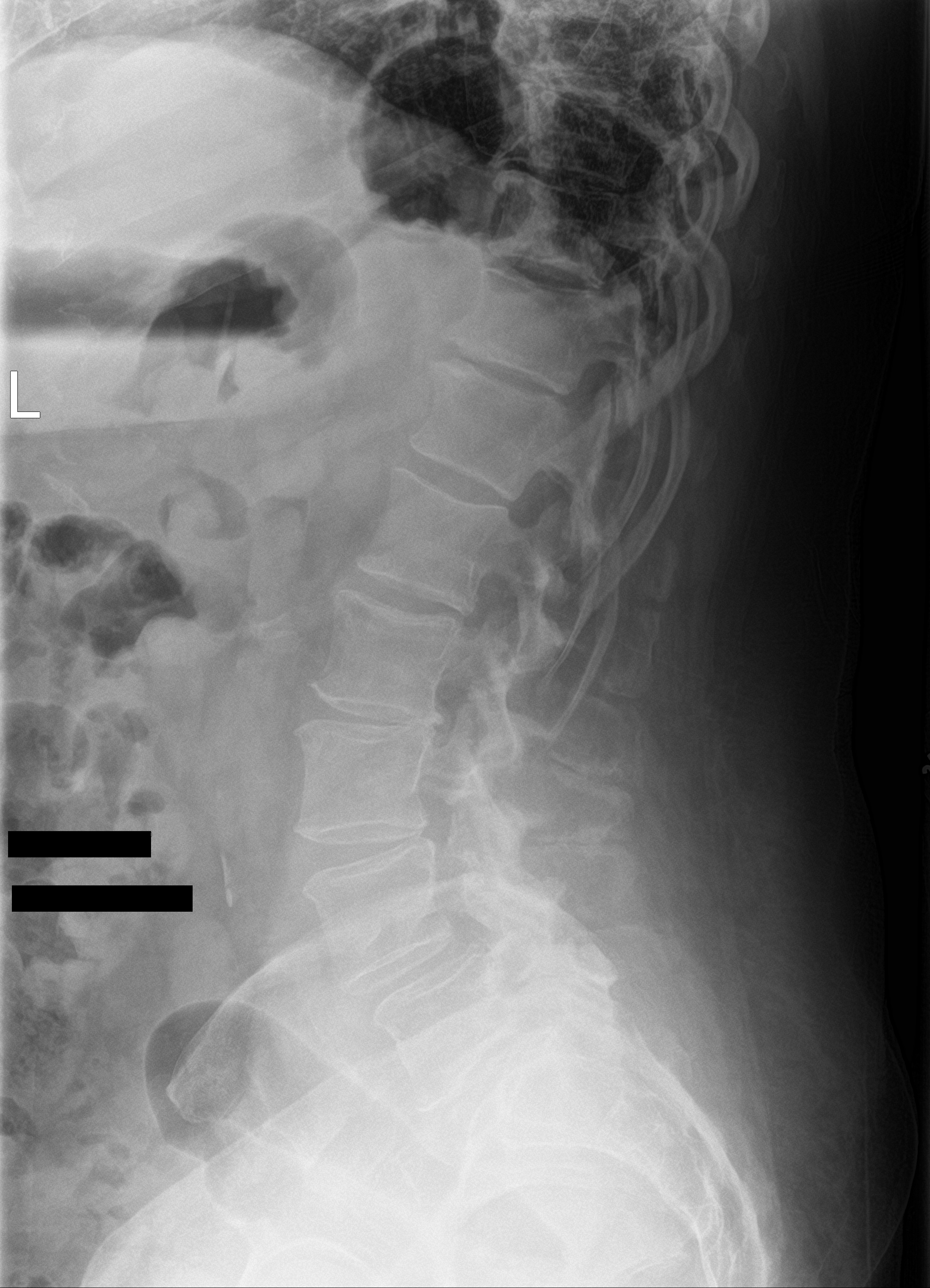

[l-spine lat (3 of 3)]
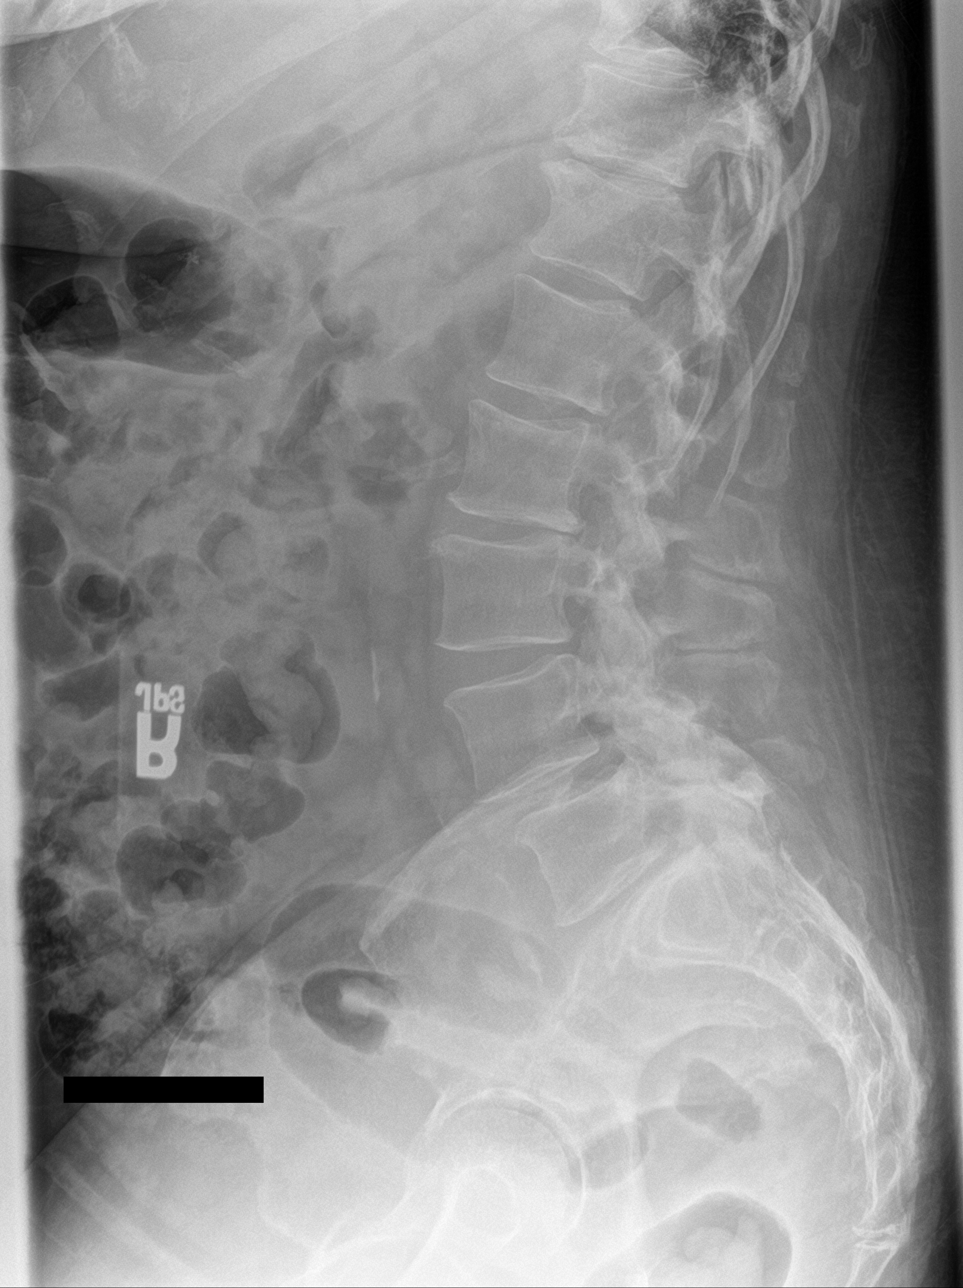

[4 of 4 positions shown; findings below may reference images not displayed]

FINDINGS: Hernia mesh noted.  Other soft tissues are unremarkable.

No inter pedicular widening identified. Grade 1 anterolisthesis of
L4 versus L5 measures 6.4 mm in the recumbent position, 6.6 mm in
flexion and 6.6 mm with extension. No significant instability with
flexion or extension at L4-5. Grade 1 anterolisthesis of L5 versus
S1 is only seen when the patient is in the recumbent position
measuring 3.6 mm. This is not visualized on flexion or extension
views. No other malalignment. No acute fractures. Mild multilevel
degenerative changes. Lower lumbar facet degenerative changes.
IMPRESSION: 1. Anterolisthesis of L4 versus L5 is grade 1 and stable on flexion
and extension views.
2. 3.6 mm of mild grade 1 anterolisthesis of L5 versus S1 is only
seen on recumbent images, and not seen on flexion or extension
views.

## 2019-08-29 IMAGING — MR MR LUMBAR SPINE W/O CM
4 of 5 series · 21 of 48 positions shown · non-contrast
Comparison: 05/20/2018 lumbar spine MRI.

CLINICAL DATA: 66 y/o F; acute on chronic lower back pain with
right-sided radicular symptoms. Multiple falls.

EXAM:
MRI LUMBAR SPINE WITHOUT CONTRAST
TECHNIQUE: Multiplanar, multisequence MR imaging of the lumbar spine was
performed. No intravenous contrast was administered.

[Series 3: T2 · sagittal · 4.5mm · 0.51mm/px · 6 of 15 slices shown (1 of 2)]
[im 1/15]
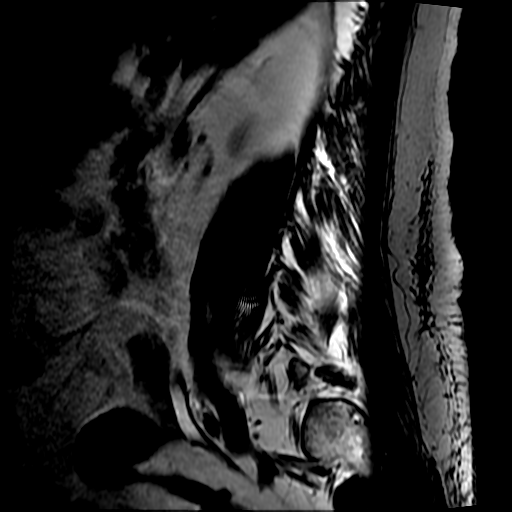
[im 3/15]
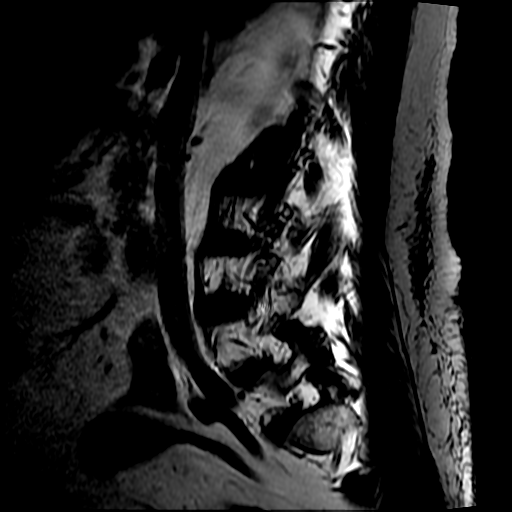
[im 6/15]
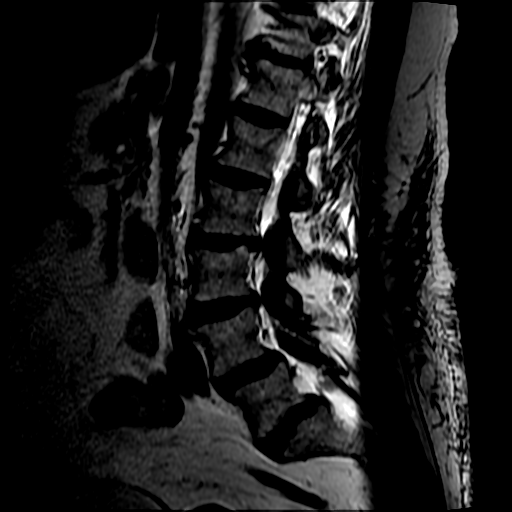
[im 9/15]
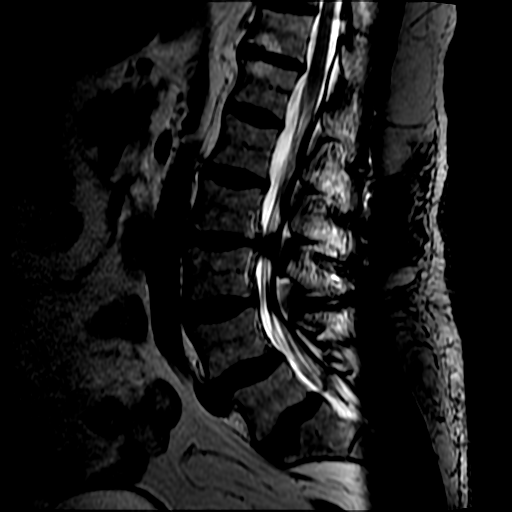
[im 12/15]
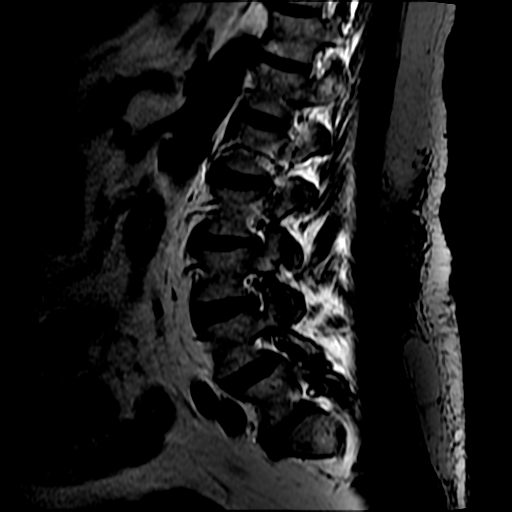
[im 15/15]
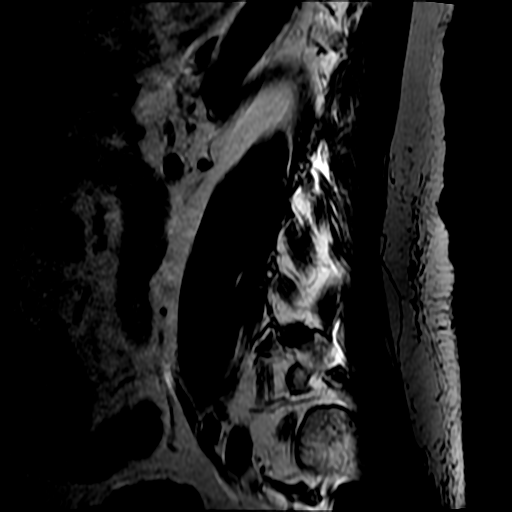

[Series 5: T1 · sagittal · 4.5mm · 0.51mm/px · 4 of 15 slices shown (1 of 2)]
[im 1/15]
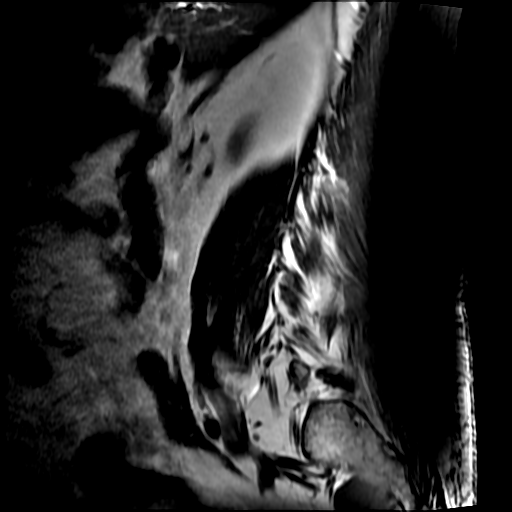
[im 3/15]
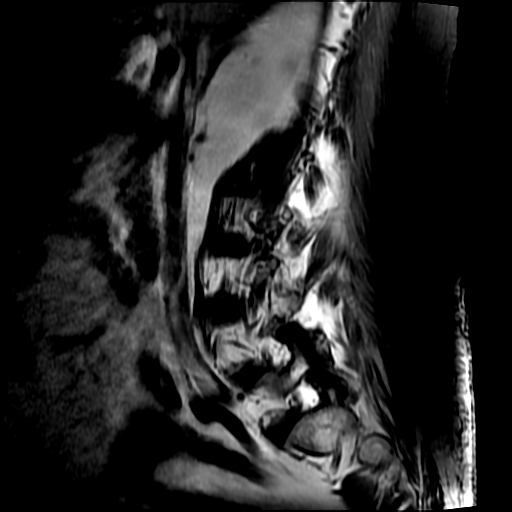
[im 8/15]
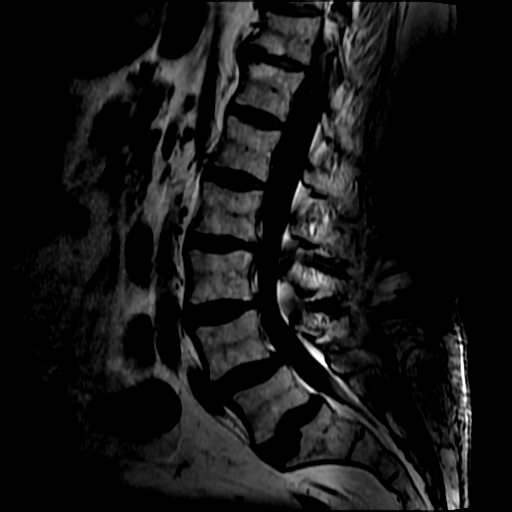
[im 12/15]
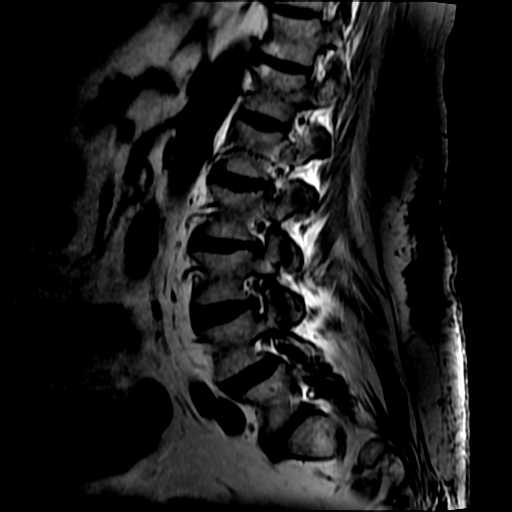

[Series 6: T2 · axial · 4.0mm · 0.43mm/px · z∈[-143,+57]mm · 8 of 32 slices shown (2 of 2)]
[im 1/32]
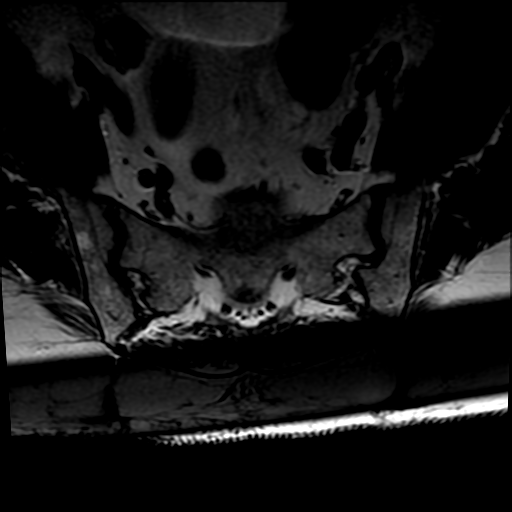
[im 5/32]
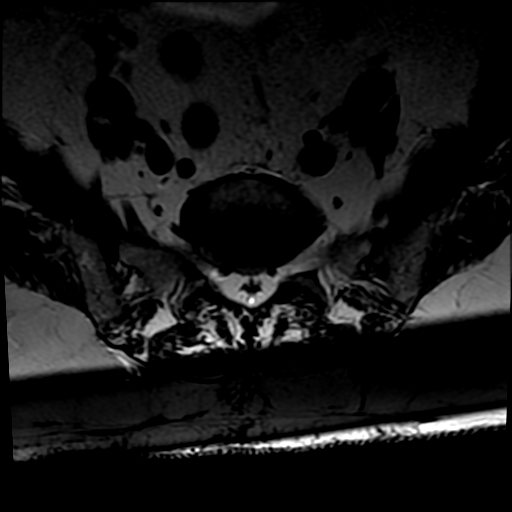
[im 10/32]
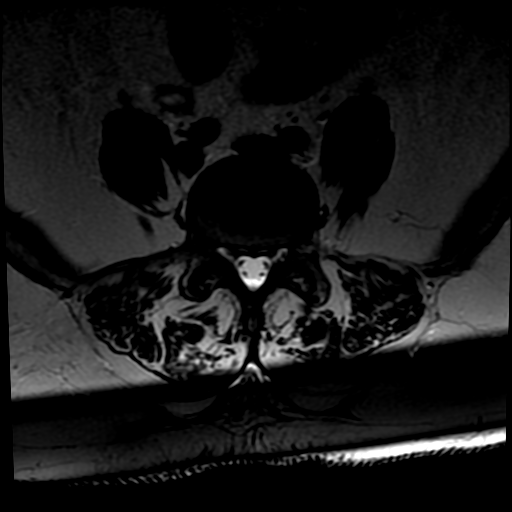
[im 15/32]
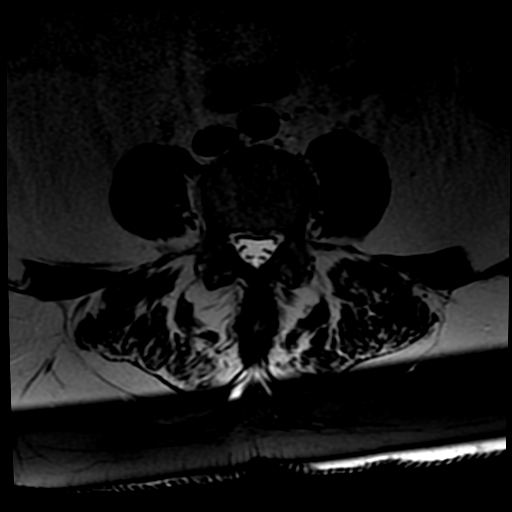
[im 17/32]
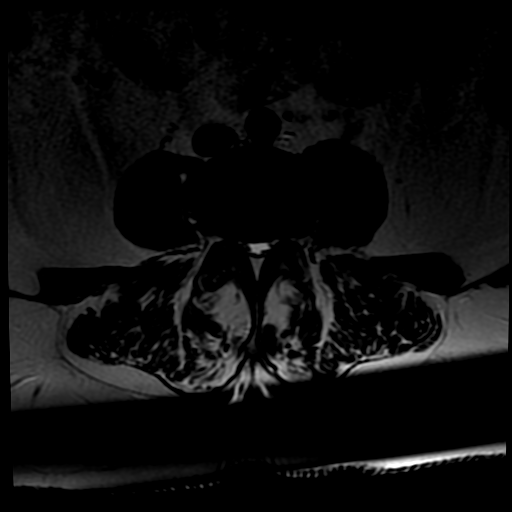
[im 22/32]
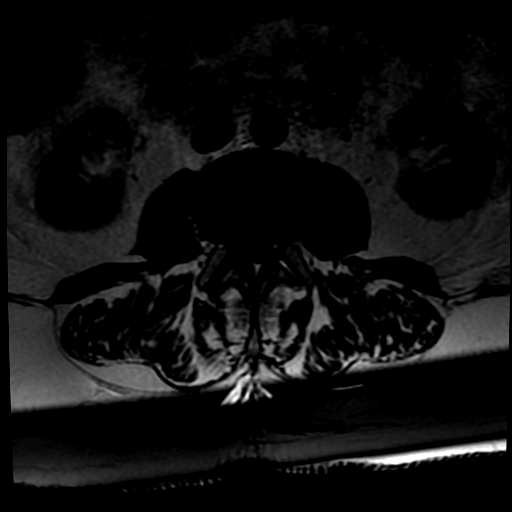
[im 27/32]
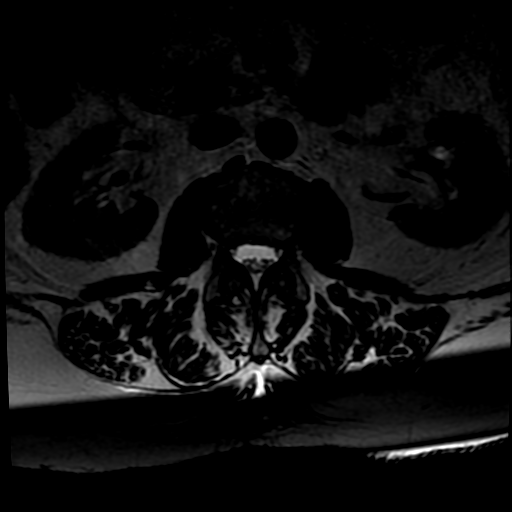
[im 32/32]
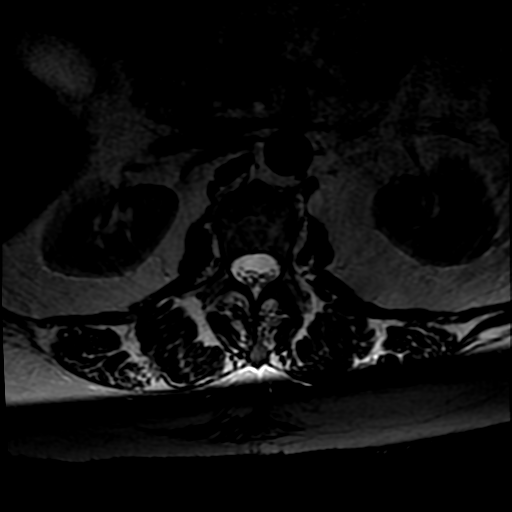

[Series 7: T1 · axial · 4.0mm · 0.86mm/px · z∈[-124,+32]mm · 3 of 32 slices shown (2 of 2)]
[im 5/32]
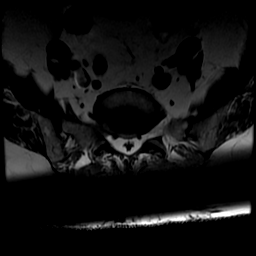
[im 17/32]
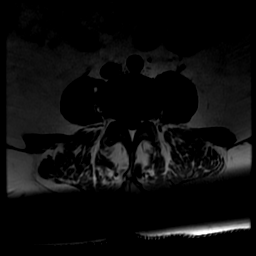
[im 27/32]
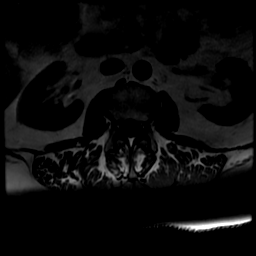

[21 of 48 positions shown; findings below may reference images not displayed]

FINDINGS: Segmentation:  Standard.

Alignment:  Stable L4-5 and L5-S1 grade 1 anterolisthesis.

Vertebrae: No fracture, evidence of discitis, or bone lesion. Mild
interspinous edema at the L2-3 and L3-4 levels compatible with
Baastrup's disease.

Conus medullaris and cauda equina: Conus extends to the L1 level.
Conus and cauda equina appear normal.

Paraspinal and other soft tissues: Negative.

Disc levels:

L1-2: Small disc bulge and mild facet hypertrophy. No significant
foraminal or canal stenosis.

L2-3: Diffuse disc bulge with facet and ligamentum flavum
hypertrophy results in mild bilateral foraminal stenosis. New
central disc protrusion with severe canal stenosis and impingement
of cauda equina (series 6, image 11).

L3-4: Small disc bulge with facet and ligamentum flavum hypertrophy.
Mild bilateral foraminal stenosis and moderate canal stenosis.

L4-5: Anterolisthesis with small uncovered disc bulge and moderate
bilateral facet hypertrophy. No significant foraminal or canal
stenosis.

L5-S1: Anterolisthesis with small uncovered disc bulge and moderate
facet hypertrophy. No significant foraminal or canal stenosis.
IMPRESSION: 1. Interval progression of L2-3 central disc protrusion with severe
canal stenosis and impingement of cauda equina.
2. No acute fracture.
3. Stable grade 1 anterolisthesis at L4-5 and L5-S1.
4. Stable lumbar spondylosis at other levels with multilevel mild
foraminal stenosis and moderate L3-4 canal stenosis.

By: Zhbllokime Samueli M.D.

## 2019-09-13 DIAGNOSIS — M549 Dorsalgia, unspecified: Secondary | ICD-10-CM | POA: Diagnosis not present

## 2019-09-13 DIAGNOSIS — R7989 Other specified abnormal findings of blood chemistry: Secondary | ICD-10-CM | POA: Diagnosis not present

## 2019-09-13 DIAGNOSIS — E7800 Pure hypercholesterolemia, unspecified: Secondary | ICD-10-CM | POA: Diagnosis not present

## 2019-09-13 DIAGNOSIS — I10 Essential (primary) hypertension: Secondary | ICD-10-CM | POA: Diagnosis not present

## 2019-09-13 DIAGNOSIS — G4700 Insomnia, unspecified: Secondary | ICD-10-CM | POA: Diagnosis not present

## 2019-09-13 DIAGNOSIS — K5909 Other constipation: Secondary | ICD-10-CM | POA: Diagnosis not present

## 2019-09-13 DIAGNOSIS — E8881 Metabolic syndrome: Secondary | ICD-10-CM | POA: Diagnosis not present

## 2019-09-13 DIAGNOSIS — K219 Gastro-esophageal reflux disease without esophagitis: Secondary | ICD-10-CM | POA: Diagnosis not present

## 2019-09-13 DIAGNOSIS — Z23 Encounter for immunization: Secondary | ICD-10-CM | POA: Diagnosis not present

## 2019-09-13 DIAGNOSIS — M542 Cervicalgia: Secondary | ICD-10-CM | POA: Diagnosis not present

## 2019-10-11 DIAGNOSIS — M549 Dorsalgia, unspecified: Secondary | ICD-10-CM | POA: Diagnosis not present

## 2019-10-11 DIAGNOSIS — M542 Cervicalgia: Secondary | ICD-10-CM | POA: Diagnosis not present

## 2019-10-11 DIAGNOSIS — G4700 Insomnia, unspecified: Secondary | ICD-10-CM | POA: Diagnosis not present

## 2019-10-11 DIAGNOSIS — G2581 Restless legs syndrome: Secondary | ICD-10-CM | POA: Diagnosis not present

## 2019-10-11 DIAGNOSIS — K5909 Other constipation: Secondary | ICD-10-CM | POA: Diagnosis not present

## 2019-10-11 DIAGNOSIS — F419 Anxiety disorder, unspecified: Secondary | ICD-10-CM | POA: Diagnosis not present

## 2019-10-11 DIAGNOSIS — K219 Gastro-esophageal reflux disease without esophagitis: Secondary | ICD-10-CM | POA: Diagnosis not present

## 2019-10-11 DIAGNOSIS — R7989 Other specified abnormal findings of blood chemistry: Secondary | ICD-10-CM | POA: Diagnosis not present

## 2019-10-11 DIAGNOSIS — E8881 Metabolic syndrome: Secondary | ICD-10-CM | POA: Diagnosis not present

## 2019-10-25 DIAGNOSIS — E785 Hyperlipidemia, unspecified: Secondary | ICD-10-CM | POA: Diagnosis not present

## 2019-10-25 DIAGNOSIS — N959 Unspecified menopausal and perimenopausal disorder: Secondary | ICD-10-CM | POA: Diagnosis not present

## 2019-10-25 DIAGNOSIS — Z9181 History of falling: Secondary | ICD-10-CM | POA: Diagnosis not present

## 2019-10-25 DIAGNOSIS — Z6835 Body mass index (BMI) 35.0-35.9, adult: Secondary | ICD-10-CM | POA: Diagnosis not present

## 2019-10-25 DIAGNOSIS — Z1331 Encounter for screening for depression: Secondary | ICD-10-CM | POA: Diagnosis not present

## 2019-10-25 DIAGNOSIS — Z0000 Encounter for general adult medical examination without abnormal findings: Secondary | ICD-10-CM | POA: Diagnosis not present

## 2019-10-27 DIAGNOSIS — Z1231 Encounter for screening mammogram for malignant neoplasm of breast: Secondary | ICD-10-CM | POA: Diagnosis not present

## 2019-10-31 DIAGNOSIS — Z13820 Encounter for screening for osteoporosis: Secondary | ICD-10-CM | POA: Diagnosis not present

## 2019-10-31 DIAGNOSIS — N959 Unspecified menopausal and perimenopausal disorder: Secondary | ICD-10-CM | POA: Diagnosis not present

## 2019-10-31 DIAGNOSIS — M85851 Other specified disorders of bone density and structure, right thigh: Secondary | ICD-10-CM | POA: Diagnosis not present

## 2019-11-08 DIAGNOSIS — G2581 Restless legs syndrome: Secondary | ICD-10-CM | POA: Diagnosis not present

## 2019-11-08 DIAGNOSIS — M542 Cervicalgia: Secondary | ICD-10-CM | POA: Diagnosis not present

## 2019-11-08 DIAGNOSIS — G4700 Insomnia, unspecified: Secondary | ICD-10-CM | POA: Diagnosis not present

## 2019-11-08 DIAGNOSIS — K219 Gastro-esophageal reflux disease without esophagitis: Secondary | ICD-10-CM | POA: Diagnosis not present

## 2019-11-08 DIAGNOSIS — M549 Dorsalgia, unspecified: Secondary | ICD-10-CM | POA: Diagnosis not present

## 2019-11-08 DIAGNOSIS — E8881 Metabolic syndrome: Secondary | ICD-10-CM | POA: Diagnosis not present

## 2019-11-08 DIAGNOSIS — K5909 Other constipation: Secondary | ICD-10-CM | POA: Diagnosis not present

## 2019-11-08 DIAGNOSIS — J208 Acute bronchitis due to other specified organisms: Secondary | ICD-10-CM | POA: Diagnosis not present

## 2019-11-08 DIAGNOSIS — R7989 Other specified abnormal findings of blood chemistry: Secondary | ICD-10-CM | POA: Diagnosis not present

## 2019-11-16 DIAGNOSIS — E669 Obesity, unspecified: Secondary | ICD-10-CM | POA: Diagnosis not present

## 2019-11-16 DIAGNOSIS — E8881 Metabolic syndrome: Secondary | ICD-10-CM | POA: Diagnosis not present

## 2019-11-16 DIAGNOSIS — M549 Dorsalgia, unspecified: Secondary | ICD-10-CM | POA: Diagnosis not present

## 2019-11-16 DIAGNOSIS — G2581 Restless legs syndrome: Secondary | ICD-10-CM | POA: Diagnosis not present

## 2019-11-16 DIAGNOSIS — Z20828 Contact with and (suspected) exposure to other viral communicable diseases: Secondary | ICD-10-CM | POA: Diagnosis not present

## 2019-11-16 DIAGNOSIS — F419 Anxiety disorder, unspecified: Secondary | ICD-10-CM | POA: Diagnosis not present

## 2019-11-16 DIAGNOSIS — R6889 Other general symptoms and signs: Secondary | ICD-10-CM | POA: Diagnosis not present

## 2019-11-16 DIAGNOSIS — F3341 Major depressive disorder, recurrent, in partial remission: Secondary | ICD-10-CM | POA: Diagnosis not present

## 2019-11-16 DIAGNOSIS — K5909 Other constipation: Secondary | ICD-10-CM | POA: Diagnosis not present

## 2019-12-06 DIAGNOSIS — G8929 Other chronic pain: Secondary | ICD-10-CM | POA: Diagnosis not present

## 2019-12-06 DIAGNOSIS — E8881 Metabolic syndrome: Secondary | ICD-10-CM | POA: Diagnosis not present

## 2019-12-06 DIAGNOSIS — M549 Dorsalgia, unspecified: Secondary | ICD-10-CM | POA: Diagnosis not present

## 2019-12-06 DIAGNOSIS — B9689 Other specified bacterial agents as the cause of diseases classified elsewhere: Secondary | ICD-10-CM | POA: Diagnosis not present

## 2019-12-06 DIAGNOSIS — K219 Gastro-esophageal reflux disease without esophagitis: Secondary | ICD-10-CM | POA: Diagnosis not present

## 2019-12-06 DIAGNOSIS — K5909 Other constipation: Secondary | ICD-10-CM | POA: Diagnosis not present

## 2019-12-06 DIAGNOSIS — J208 Acute bronchitis due to other specified organisms: Secondary | ICD-10-CM | POA: Diagnosis not present

## 2019-12-06 DIAGNOSIS — G2581 Restless legs syndrome: Secondary | ICD-10-CM | POA: Diagnosis not present

## 2019-12-06 DIAGNOSIS — F3341 Major depressive disorder, recurrent, in partial remission: Secondary | ICD-10-CM | POA: Diagnosis not present

## 2020-01-03 DIAGNOSIS — F419 Anxiety disorder, unspecified: Secondary | ICD-10-CM | POA: Diagnosis not present

## 2020-01-03 DIAGNOSIS — E8881 Metabolic syndrome: Secondary | ICD-10-CM | POA: Diagnosis not present

## 2020-01-03 DIAGNOSIS — G8929 Other chronic pain: Secondary | ICD-10-CM | POA: Diagnosis not present

## 2020-01-03 DIAGNOSIS — F3341 Major depressive disorder, recurrent, in partial remission: Secondary | ICD-10-CM | POA: Diagnosis not present

## 2020-01-03 DIAGNOSIS — G2581 Restless legs syndrome: Secondary | ICD-10-CM | POA: Diagnosis not present

## 2020-01-03 DIAGNOSIS — M549 Dorsalgia, unspecified: Secondary | ICD-10-CM | POA: Diagnosis not present

## 2020-01-03 DIAGNOSIS — K5909 Other constipation: Secondary | ICD-10-CM | POA: Diagnosis not present

## 2020-01-03 DIAGNOSIS — I10 Essential (primary) hypertension: Secondary | ICD-10-CM | POA: Diagnosis not present

## 2020-01-03 DIAGNOSIS — K219 Gastro-esophageal reflux disease without esophagitis: Secondary | ICD-10-CM | POA: Diagnosis not present

## 2020-01-30 DIAGNOSIS — E8881 Metabolic syndrome: Secondary | ICD-10-CM | POA: Diagnosis not present

## 2020-01-30 DIAGNOSIS — E7800 Pure hypercholesterolemia, unspecified: Secondary | ICD-10-CM | POA: Diagnosis not present

## 2020-01-30 DIAGNOSIS — K5909 Other constipation: Secondary | ICD-10-CM | POA: Diagnosis not present

## 2020-01-30 DIAGNOSIS — G2581 Restless legs syndrome: Secondary | ICD-10-CM | POA: Diagnosis not present

## 2020-01-30 DIAGNOSIS — M549 Dorsalgia, unspecified: Secondary | ICD-10-CM | POA: Diagnosis not present

## 2020-01-30 DIAGNOSIS — K219 Gastro-esophageal reflux disease without esophagitis: Secondary | ICD-10-CM | POA: Diagnosis not present

## 2020-01-30 DIAGNOSIS — I10 Essential (primary) hypertension: Secondary | ICD-10-CM | POA: Diagnosis not present

## 2020-01-30 DIAGNOSIS — F419 Anxiety disorder, unspecified: Secondary | ICD-10-CM | POA: Diagnosis not present

## 2020-01-30 DIAGNOSIS — F3341 Major depressive disorder, recurrent, in partial remission: Secondary | ICD-10-CM | POA: Diagnosis not present

## 2020-02-25 DIAGNOSIS — K219 Gastro-esophageal reflux disease without esophagitis: Secondary | ICD-10-CM | POA: Diagnosis not present

## 2020-02-25 DIAGNOSIS — G2581 Restless legs syndrome: Secondary | ICD-10-CM | POA: Diagnosis not present

## 2020-02-25 DIAGNOSIS — E8881 Metabolic syndrome: Secondary | ICD-10-CM | POA: Diagnosis not present

## 2020-02-25 DIAGNOSIS — I10 Essential (primary) hypertension: Secondary | ICD-10-CM | POA: Diagnosis not present

## 2020-02-25 DIAGNOSIS — E7800 Pure hypercholesterolemia, unspecified: Secondary | ICD-10-CM | POA: Diagnosis not present

## 2020-02-25 DIAGNOSIS — B9689 Other specified bacterial agents as the cause of diseases classified elsewhere: Secondary | ICD-10-CM | POA: Diagnosis not present

## 2020-02-25 DIAGNOSIS — J208 Acute bronchitis due to other specified organisms: Secondary | ICD-10-CM | POA: Diagnosis not present

## 2020-02-25 DIAGNOSIS — K5909 Other constipation: Secondary | ICD-10-CM | POA: Diagnosis not present

## 2020-02-25 DIAGNOSIS — M549 Dorsalgia, unspecified: Secondary | ICD-10-CM | POA: Diagnosis not present

## 2020-03-22 DIAGNOSIS — I10 Essential (primary) hypertension: Secondary | ICD-10-CM | POA: Diagnosis not present

## 2020-03-22 DIAGNOSIS — G2581 Restless legs syndrome: Secondary | ICD-10-CM | POA: Diagnosis not present

## 2020-03-22 DIAGNOSIS — K219 Gastro-esophageal reflux disease without esophagitis: Secondary | ICD-10-CM | POA: Diagnosis not present

## 2020-03-22 DIAGNOSIS — K5909 Other constipation: Secondary | ICD-10-CM | POA: Diagnosis not present

## 2020-03-22 DIAGNOSIS — M549 Dorsalgia, unspecified: Secondary | ICD-10-CM | POA: Diagnosis not present

## 2020-03-22 DIAGNOSIS — E7800 Pure hypercholesterolemia, unspecified: Secondary | ICD-10-CM | POA: Diagnosis not present

## 2020-03-22 DIAGNOSIS — E8881 Metabolic syndrome: Secondary | ICD-10-CM | POA: Diagnosis not present

## 2020-03-22 DIAGNOSIS — F3341 Major depressive disorder, recurrent, in partial remission: Secondary | ICD-10-CM | POA: Diagnosis not present

## 2020-03-22 DIAGNOSIS — F419 Anxiety disorder, unspecified: Secondary | ICD-10-CM | POA: Diagnosis not present

## 2020-03-29 DIAGNOSIS — K219 Gastro-esophageal reflux disease without esophagitis: Secondary | ICD-10-CM | POA: Diagnosis not present

## 2020-03-29 DIAGNOSIS — F3341 Major depressive disorder, recurrent, in partial remission: Secondary | ICD-10-CM | POA: Diagnosis not present

## 2020-03-29 DIAGNOSIS — R7989 Other specified abnormal findings of blood chemistry: Secondary | ICD-10-CM | POA: Diagnosis not present

## 2020-03-29 DIAGNOSIS — G2581 Restless legs syndrome: Secondary | ICD-10-CM | POA: Diagnosis not present

## 2020-03-29 DIAGNOSIS — F419 Anxiety disorder, unspecified: Secondary | ICD-10-CM | POA: Diagnosis not present

## 2020-03-29 DIAGNOSIS — M542 Cervicalgia: Secondary | ICD-10-CM | POA: Diagnosis not present

## 2020-03-29 DIAGNOSIS — I10 Essential (primary) hypertension: Secondary | ICD-10-CM | POA: Diagnosis not present

## 2020-03-29 DIAGNOSIS — E8881 Metabolic syndrome: Secondary | ICD-10-CM | POA: Diagnosis not present

## 2020-03-29 DIAGNOSIS — K5909 Other constipation: Secondary | ICD-10-CM | POA: Diagnosis not present

## 2020-04-19 DIAGNOSIS — K5909 Other constipation: Secondary | ICD-10-CM | POA: Diagnosis not present

## 2020-04-19 DIAGNOSIS — F3341 Major depressive disorder, recurrent, in partial remission: Secondary | ICD-10-CM | POA: Diagnosis not present

## 2020-04-19 DIAGNOSIS — G2581 Restless legs syndrome: Secondary | ICD-10-CM | POA: Diagnosis not present

## 2020-04-19 DIAGNOSIS — R7989 Other specified abnormal findings of blood chemistry: Secondary | ICD-10-CM | POA: Diagnosis not present

## 2020-04-19 DIAGNOSIS — F419 Anxiety disorder, unspecified: Secondary | ICD-10-CM | POA: Diagnosis not present

## 2020-04-19 DIAGNOSIS — I10 Essential (primary) hypertension: Secondary | ICD-10-CM | POA: Diagnosis not present

## 2020-04-19 DIAGNOSIS — K219 Gastro-esophageal reflux disease without esophagitis: Secondary | ICD-10-CM | POA: Diagnosis not present

## 2020-04-19 DIAGNOSIS — J208 Acute bronchitis due to other specified organisms: Secondary | ICD-10-CM | POA: Diagnosis not present

## 2020-04-19 DIAGNOSIS — E8881 Metabolic syndrome: Secondary | ICD-10-CM | POA: Diagnosis not present

## 2020-05-18 DIAGNOSIS — K5909 Other constipation: Secondary | ICD-10-CM | POA: Diagnosis not present

## 2020-05-18 DIAGNOSIS — J208 Acute bronchitis due to other specified organisms: Secondary | ICD-10-CM | POA: Diagnosis not present

## 2020-05-18 DIAGNOSIS — F3341 Major depressive disorder, recurrent, in partial remission: Secondary | ICD-10-CM | POA: Diagnosis not present

## 2020-05-18 DIAGNOSIS — G2581 Restless legs syndrome: Secondary | ICD-10-CM | POA: Diagnosis not present

## 2020-05-18 DIAGNOSIS — K219 Gastro-esophageal reflux disease without esophagitis: Secondary | ICD-10-CM | POA: Diagnosis not present

## 2020-05-18 DIAGNOSIS — Z6836 Body mass index (BMI) 36.0-36.9, adult: Secondary | ICD-10-CM | POA: Diagnosis not present

## 2020-05-18 DIAGNOSIS — E8881 Metabolic syndrome: Secondary | ICD-10-CM | POA: Diagnosis not present

## 2020-06-15 DIAGNOSIS — J208 Acute bronchitis due to other specified organisms: Secondary | ICD-10-CM | POA: Diagnosis not present

## 2020-06-15 DIAGNOSIS — K219 Gastro-esophageal reflux disease without esophagitis: Secondary | ICD-10-CM | POA: Diagnosis not present

## 2020-06-15 DIAGNOSIS — R7989 Other specified abnormal findings of blood chemistry: Secondary | ICD-10-CM | POA: Diagnosis not present

## 2020-06-15 DIAGNOSIS — G2581 Restless legs syndrome: Secondary | ICD-10-CM | POA: Diagnosis not present

## 2020-06-15 DIAGNOSIS — F3341 Major depressive disorder, recurrent, in partial remission: Secondary | ICD-10-CM | POA: Diagnosis not present

## 2020-06-15 DIAGNOSIS — I10 Essential (primary) hypertension: Secondary | ICD-10-CM | POA: Diagnosis not present

## 2020-06-15 DIAGNOSIS — E8881 Metabolic syndrome: Secondary | ICD-10-CM | POA: Diagnosis not present

## 2020-06-15 DIAGNOSIS — K5909 Other constipation: Secondary | ICD-10-CM | POA: Diagnosis not present

## 2020-06-15 DIAGNOSIS — B9689 Other specified bacterial agents as the cause of diseases classified elsewhere: Secondary | ICD-10-CM | POA: Diagnosis not present

## 2020-07-13 DIAGNOSIS — E8881 Metabolic syndrome: Secondary | ICD-10-CM | POA: Diagnosis not present

## 2020-07-13 DIAGNOSIS — R7989 Other specified abnormal findings of blood chemistry: Secondary | ICD-10-CM | POA: Diagnosis not present

## 2020-07-13 DIAGNOSIS — K219 Gastro-esophageal reflux disease without esophagitis: Secondary | ICD-10-CM | POA: Diagnosis not present

## 2020-07-13 DIAGNOSIS — I10 Essential (primary) hypertension: Secondary | ICD-10-CM | POA: Diagnosis not present

## 2020-07-13 DIAGNOSIS — G2581 Restless legs syndrome: Secondary | ICD-10-CM | POA: Diagnosis not present

## 2020-07-13 DIAGNOSIS — K5909 Other constipation: Secondary | ICD-10-CM | POA: Diagnosis not present

## 2020-07-13 DIAGNOSIS — J208 Acute bronchitis due to other specified organisms: Secondary | ICD-10-CM | POA: Diagnosis not present

## 2020-07-13 DIAGNOSIS — B9689 Other specified bacterial agents as the cause of diseases classified elsewhere: Secondary | ICD-10-CM | POA: Diagnosis not present

## 2020-07-13 DIAGNOSIS — F3341 Major depressive disorder, recurrent, in partial remission: Secondary | ICD-10-CM | POA: Diagnosis not present

## 2020-08-09 DIAGNOSIS — M542 Cervicalgia: Secondary | ICD-10-CM | POA: Diagnosis not present

## 2020-08-09 DIAGNOSIS — K5909 Other constipation: Secondary | ICD-10-CM | POA: Diagnosis not present

## 2020-08-09 DIAGNOSIS — G8929 Other chronic pain: Secondary | ICD-10-CM | POA: Diagnosis not present

## 2020-08-09 DIAGNOSIS — I10 Essential (primary) hypertension: Secondary | ICD-10-CM | POA: Diagnosis not present

## 2020-08-09 DIAGNOSIS — G2581 Restless legs syndrome: Secondary | ICD-10-CM | POA: Diagnosis not present

## 2020-08-09 DIAGNOSIS — K219 Gastro-esophageal reflux disease without esophagitis: Secondary | ICD-10-CM | POA: Diagnosis not present

## 2020-08-09 DIAGNOSIS — E8881 Metabolic syndrome: Secondary | ICD-10-CM | POA: Diagnosis not present

## 2020-08-09 DIAGNOSIS — R7989 Other specified abnormal findings of blood chemistry: Secondary | ICD-10-CM | POA: Diagnosis not present

## 2020-08-09 DIAGNOSIS — F3341 Major depressive disorder, recurrent, in partial remission: Secondary | ICD-10-CM | POA: Diagnosis not present

## 2020-09-07 DIAGNOSIS — I10 Essential (primary) hypertension: Secondary | ICD-10-CM | POA: Diagnosis not present

## 2020-09-07 DIAGNOSIS — E8881 Metabolic syndrome: Secondary | ICD-10-CM | POA: Diagnosis not present

## 2020-09-07 DIAGNOSIS — K219 Gastro-esophageal reflux disease without esophagitis: Secondary | ICD-10-CM | POA: Diagnosis not present

## 2020-09-07 DIAGNOSIS — E7800 Pure hypercholesterolemia, unspecified: Secondary | ICD-10-CM | POA: Diagnosis not present

## 2020-09-07 DIAGNOSIS — Z139 Encounter for screening, unspecified: Secondary | ICD-10-CM | POA: Diagnosis not present

## 2020-09-07 DIAGNOSIS — M542 Cervicalgia: Secondary | ICD-10-CM | POA: Diagnosis not present

## 2020-09-07 DIAGNOSIS — G2581 Restless legs syndrome: Secondary | ICD-10-CM | POA: Diagnosis not present

## 2020-09-07 DIAGNOSIS — G8929 Other chronic pain: Secondary | ICD-10-CM | POA: Diagnosis not present

## 2020-09-07 DIAGNOSIS — K5909 Other constipation: Secondary | ICD-10-CM | POA: Diagnosis not present

## 2020-09-07 DIAGNOSIS — R7989 Other specified abnormal findings of blood chemistry: Secondary | ICD-10-CM | POA: Diagnosis not present

## 2020-09-28 DIAGNOSIS — K219 Gastro-esophageal reflux disease without esophagitis: Secondary | ICD-10-CM | POA: Diagnosis not present

## 2020-09-28 DIAGNOSIS — R7989 Other specified abnormal findings of blood chemistry: Secondary | ICD-10-CM | POA: Diagnosis not present

## 2020-09-28 DIAGNOSIS — Z23 Encounter for immunization: Secondary | ICD-10-CM | POA: Diagnosis not present

## 2020-09-28 DIAGNOSIS — K5909 Other constipation: Secondary | ICD-10-CM | POA: Diagnosis not present

## 2020-09-28 DIAGNOSIS — I10 Essential (primary) hypertension: Secondary | ICD-10-CM | POA: Diagnosis not present

## 2020-09-28 DIAGNOSIS — M542 Cervicalgia: Secondary | ICD-10-CM | POA: Diagnosis not present

## 2020-09-28 DIAGNOSIS — E7800 Pure hypercholesterolemia, unspecified: Secondary | ICD-10-CM | POA: Diagnosis not present

## 2020-09-28 DIAGNOSIS — G2581 Restless legs syndrome: Secondary | ICD-10-CM | POA: Diagnosis not present

## 2020-09-28 DIAGNOSIS — E8881 Metabolic syndrome: Secondary | ICD-10-CM | POA: Diagnosis not present

## 2020-10-26 DIAGNOSIS — K219 Gastro-esophageal reflux disease without esophagitis: Secondary | ICD-10-CM | POA: Diagnosis not present

## 2020-10-26 DIAGNOSIS — K5909 Other constipation: Secondary | ICD-10-CM | POA: Diagnosis not present

## 2020-10-26 DIAGNOSIS — Z6836 Body mass index (BMI) 36.0-36.9, adult: Secondary | ICD-10-CM | POA: Diagnosis not present

## 2020-10-26 DIAGNOSIS — E8881 Metabolic syndrome: Secondary | ICD-10-CM | POA: Diagnosis not present

## 2020-10-26 DIAGNOSIS — E7800 Pure hypercholesterolemia, unspecified: Secondary | ICD-10-CM | POA: Diagnosis not present

## 2020-10-26 DIAGNOSIS — R7989 Other specified abnormal findings of blood chemistry: Secondary | ICD-10-CM | POA: Diagnosis not present

## 2020-10-26 DIAGNOSIS — G2581 Restless legs syndrome: Secondary | ICD-10-CM | POA: Diagnosis not present

## 2020-10-26 DIAGNOSIS — I10 Essential (primary) hypertension: Secondary | ICD-10-CM | POA: Diagnosis not present

## 2020-11-23 DIAGNOSIS — E8881 Metabolic syndrome: Secondary | ICD-10-CM | POA: Diagnosis not present

## 2020-11-23 DIAGNOSIS — J208 Acute bronchitis due to other specified organisms: Secondary | ICD-10-CM | POA: Diagnosis not present

## 2020-11-23 DIAGNOSIS — I10 Essential (primary) hypertension: Secondary | ICD-10-CM | POA: Diagnosis not present

## 2020-11-23 DIAGNOSIS — B9689 Other specified bacterial agents as the cause of diseases classified elsewhere: Secondary | ICD-10-CM | POA: Diagnosis not present

## 2020-11-23 DIAGNOSIS — K219 Gastro-esophageal reflux disease without esophagitis: Secondary | ICD-10-CM | POA: Diagnosis not present

## 2020-11-23 DIAGNOSIS — K5909 Other constipation: Secondary | ICD-10-CM | POA: Diagnosis not present

## 2020-11-23 DIAGNOSIS — R7989 Other specified abnormal findings of blood chemistry: Secondary | ICD-10-CM | POA: Diagnosis not present

## 2020-11-23 DIAGNOSIS — G2581 Restless legs syndrome: Secondary | ICD-10-CM | POA: Diagnosis not present

## 2020-11-23 DIAGNOSIS — E7800 Pure hypercholesterolemia, unspecified: Secondary | ICD-10-CM | POA: Diagnosis not present

## 2020-12-26 DIAGNOSIS — I10 Essential (primary) hypertension: Secondary | ICD-10-CM | POA: Diagnosis not present

## 2020-12-26 DIAGNOSIS — G2581 Restless legs syndrome: Secondary | ICD-10-CM | POA: Diagnosis not present

## 2020-12-26 DIAGNOSIS — M542 Cervicalgia: Secondary | ICD-10-CM | POA: Diagnosis not present

## 2020-12-26 DIAGNOSIS — R7989 Other specified abnormal findings of blood chemistry: Secondary | ICD-10-CM | POA: Diagnosis not present

## 2020-12-26 DIAGNOSIS — Z9181 History of falling: Secondary | ICD-10-CM | POA: Diagnosis not present

## 2020-12-26 DIAGNOSIS — K5909 Other constipation: Secondary | ICD-10-CM | POA: Diagnosis not present

## 2020-12-26 DIAGNOSIS — K219 Gastro-esophageal reflux disease without esophagitis: Secondary | ICD-10-CM | POA: Diagnosis not present

## 2020-12-26 DIAGNOSIS — E8881 Metabolic syndrome: Secondary | ICD-10-CM | POA: Diagnosis not present

## 2020-12-26 DIAGNOSIS — E7800 Pure hypercholesterolemia, unspecified: Secondary | ICD-10-CM | POA: Diagnosis not present

## 2021-01-21 DIAGNOSIS — I10 Essential (primary) hypertension: Secondary | ICD-10-CM | POA: Diagnosis not present

## 2021-01-21 DIAGNOSIS — R6889 Other general symptoms and signs: Secondary | ICD-10-CM | POA: Diagnosis not present

## 2021-01-21 DIAGNOSIS — Z6837 Body mass index (BMI) 37.0-37.9, adult: Secondary | ICD-10-CM | POA: Diagnosis not present

## 2021-01-21 DIAGNOSIS — F3341 Major depressive disorder, recurrent, in partial remission: Secondary | ICD-10-CM | POA: Diagnosis not present

## 2021-01-21 DIAGNOSIS — Z20822 Contact with and (suspected) exposure to covid-19: Secondary | ICD-10-CM | POA: Diagnosis not present

## 2021-01-23 DIAGNOSIS — Z6837 Body mass index (BMI) 37.0-37.9, adult: Secondary | ICD-10-CM | POA: Diagnosis not present

## 2021-01-23 DIAGNOSIS — M542 Cervicalgia: Secondary | ICD-10-CM | POA: Diagnosis not present

## 2021-01-23 DIAGNOSIS — G4700 Insomnia, unspecified: Secondary | ICD-10-CM | POA: Diagnosis not present

## 2021-01-23 DIAGNOSIS — I10 Essential (primary) hypertension: Secondary | ICD-10-CM | POA: Diagnosis not present

## 2021-01-23 DIAGNOSIS — G8929 Other chronic pain: Secondary | ICD-10-CM | POA: Diagnosis not present

## 2021-01-23 DIAGNOSIS — F3341 Major depressive disorder, recurrent, in partial remission: Secondary | ICD-10-CM | POA: Diagnosis not present

## 2021-01-23 DIAGNOSIS — M549 Dorsalgia, unspecified: Secondary | ICD-10-CM | POA: Diagnosis not present

## 2021-01-23 DIAGNOSIS — U071 COVID-19: Secondary | ICD-10-CM | POA: Diagnosis not present

## 2021-01-26 DIAGNOSIS — I480 Paroxysmal atrial fibrillation: Secondary | ICD-10-CM | POA: Diagnosis not present

## 2021-01-26 DIAGNOSIS — E7849 Other hyperlipidemia: Secondary | ICD-10-CM | POA: Diagnosis not present

## 2021-01-26 DIAGNOSIS — I517 Cardiomegaly: Secondary | ICD-10-CM | POA: Diagnosis not present

## 2021-01-26 DIAGNOSIS — E871 Hypo-osmolality and hyponatremia: Secondary | ICD-10-CM | POA: Diagnosis not present

## 2021-01-26 DIAGNOSIS — I4891 Unspecified atrial fibrillation: Secondary | ICD-10-CM | POA: Insufficient documentation

## 2021-01-26 DIAGNOSIS — I509 Heart failure, unspecified: Secondary | ICD-10-CM | POA: Diagnosis not present

## 2021-01-26 DIAGNOSIS — E876 Hypokalemia: Secondary | ICD-10-CM | POA: Diagnosis not present

## 2021-01-26 DIAGNOSIS — I10 Essential (primary) hypertension: Secondary | ICD-10-CM | POA: Diagnosis not present

## 2021-01-26 DIAGNOSIS — F32A Depression, unspecified: Secondary | ICD-10-CM | POA: Diagnosis not present

## 2021-01-26 DIAGNOSIS — R059 Cough, unspecified: Secondary | ICD-10-CM | POA: Diagnosis not present

## 2021-01-26 DIAGNOSIS — J189 Pneumonia, unspecified organism: Secondary | ICD-10-CM | POA: Diagnosis not present

## 2021-01-26 DIAGNOSIS — K219 Gastro-esophageal reflux disease without esophagitis: Secondary | ICD-10-CM | POA: Diagnosis not present

## 2021-01-26 DIAGNOSIS — E785 Hyperlipidemia, unspecified: Secondary | ICD-10-CM | POA: Diagnosis not present

## 2021-01-26 DIAGNOSIS — I493 Ventricular premature depolarization: Secondary | ICD-10-CM | POA: Diagnosis not present

## 2021-01-26 DIAGNOSIS — G4700 Insomnia, unspecified: Secondary | ICD-10-CM | POA: Diagnosis not present

## 2021-01-26 DIAGNOSIS — R069 Unspecified abnormalities of breathing: Secondary | ICD-10-CM | POA: Diagnosis not present

## 2021-01-26 DIAGNOSIS — I491 Atrial premature depolarization: Secondary | ICD-10-CM | POA: Diagnosis not present

## 2021-01-26 DIAGNOSIS — Z79899 Other long term (current) drug therapy: Secondary | ICD-10-CM | POA: Diagnosis not present

## 2021-01-26 DIAGNOSIS — J9811 Atelectasis: Secondary | ICD-10-CM | POA: Diagnosis not present

## 2021-01-26 DIAGNOSIS — U071 COVID-19: Secondary | ICD-10-CM | POA: Diagnosis not present

## 2021-01-26 DIAGNOSIS — J90 Pleural effusion, not elsewhere classified: Secondary | ICD-10-CM | POA: Diagnosis not present

## 2021-01-26 DIAGNOSIS — R0602 Shortness of breath: Secondary | ICD-10-CM | POA: Diagnosis not present

## 2021-01-26 DIAGNOSIS — R0902 Hypoxemia: Secondary | ICD-10-CM | POA: Diagnosis not present

## 2021-01-26 DIAGNOSIS — I340 Nonrheumatic mitral (valve) insufficiency: Secondary | ICD-10-CM | POA: Diagnosis not present

## 2021-01-26 DIAGNOSIS — I447 Left bundle-branch block, unspecified: Secondary | ICD-10-CM | POA: Diagnosis not present

## 2021-01-26 DIAGNOSIS — J1282 Pneumonia due to coronavirus disease 2019: Secondary | ICD-10-CM | POA: Diagnosis not present

## 2021-01-27 DIAGNOSIS — I447 Left bundle-branch block, unspecified: Secondary | ICD-10-CM | POA: Diagnosis not present

## 2021-01-27 DIAGNOSIS — E871 Hypo-osmolality and hyponatremia: Secondary | ICD-10-CM | POA: Diagnosis not present

## 2021-01-27 DIAGNOSIS — E876 Hypokalemia: Secondary | ICD-10-CM | POA: Diagnosis not present

## 2021-01-27 DIAGNOSIS — U071 COVID-19: Secondary | ICD-10-CM | POA: Diagnosis not present

## 2021-01-27 DIAGNOSIS — I491 Atrial premature depolarization: Secondary | ICD-10-CM | POA: Diagnosis not present

## 2021-01-27 DIAGNOSIS — I493 Ventricular premature depolarization: Secondary | ICD-10-CM | POA: Diagnosis not present

## 2021-01-27 DIAGNOSIS — E7849 Other hyperlipidemia: Secondary | ICD-10-CM | POA: Diagnosis not present

## 2021-01-27 DIAGNOSIS — K219 Gastro-esophageal reflux disease without esophagitis: Secondary | ICD-10-CM | POA: Diagnosis not present

## 2021-01-27 DIAGNOSIS — E785 Hyperlipidemia, unspecified: Secondary | ICD-10-CM | POA: Diagnosis not present

## 2021-01-27 DIAGNOSIS — I4891 Unspecified atrial fibrillation: Secondary | ICD-10-CM | POA: Diagnosis not present

## 2021-01-27 DIAGNOSIS — I10 Essential (primary) hypertension: Secondary | ICD-10-CM | POA: Diagnosis not present

## 2021-01-27 DIAGNOSIS — J1282 Pneumonia due to coronavirus disease 2019: Secondary | ICD-10-CM | POA: Diagnosis not present

## 2021-01-28 DIAGNOSIS — R059 Cough, unspecified: Secondary | ICD-10-CM | POA: Diagnosis not present

## 2021-01-28 DIAGNOSIS — E785 Hyperlipidemia, unspecified: Secondary | ICD-10-CM | POA: Diagnosis not present

## 2021-01-28 DIAGNOSIS — J9811 Atelectasis: Secondary | ICD-10-CM | POA: Diagnosis not present

## 2021-01-28 DIAGNOSIS — E876 Hypokalemia: Secondary | ICD-10-CM | POA: Diagnosis not present

## 2021-01-28 DIAGNOSIS — Z79899 Other long term (current) drug therapy: Secondary | ICD-10-CM | POA: Diagnosis not present

## 2021-01-28 DIAGNOSIS — E871 Hypo-osmolality and hyponatremia: Secondary | ICD-10-CM | POA: Diagnosis not present

## 2021-01-28 DIAGNOSIS — I447 Left bundle-branch block, unspecified: Secondary | ICD-10-CM | POA: Diagnosis not present

## 2021-01-28 DIAGNOSIS — E7849 Other hyperlipidemia: Secondary | ICD-10-CM | POA: Diagnosis not present

## 2021-01-28 DIAGNOSIS — U071 COVID-19: Secondary | ICD-10-CM | POA: Diagnosis not present

## 2021-01-28 DIAGNOSIS — J1282 Pneumonia due to coronavirus disease 2019: Secondary | ICD-10-CM | POA: Diagnosis not present

## 2021-01-28 DIAGNOSIS — I517 Cardiomegaly: Secondary | ICD-10-CM | POA: Diagnosis not present

## 2021-01-28 DIAGNOSIS — I10 Essential (primary) hypertension: Secondary | ICD-10-CM | POA: Diagnosis not present

## 2021-01-28 DIAGNOSIS — I4891 Unspecified atrial fibrillation: Secondary | ICD-10-CM | POA: Diagnosis not present

## 2021-01-28 DIAGNOSIS — I340 Nonrheumatic mitral (valve) insufficiency: Secondary | ICD-10-CM | POA: Insufficient documentation

## 2021-01-28 DIAGNOSIS — K219 Gastro-esophageal reflux disease without esophagitis: Secondary | ICD-10-CM | POA: Diagnosis not present

## 2021-01-29 DIAGNOSIS — J1282 Pneumonia due to coronavirus disease 2019: Secondary | ICD-10-CM | POA: Diagnosis not present

## 2021-01-29 DIAGNOSIS — I4891 Unspecified atrial fibrillation: Secondary | ICD-10-CM | POA: Diagnosis not present

## 2021-01-29 DIAGNOSIS — E785 Hyperlipidemia, unspecified: Secondary | ICD-10-CM | POA: Diagnosis not present

## 2021-01-29 DIAGNOSIS — E871 Hypo-osmolality and hyponatremia: Secondary | ICD-10-CM | POA: Diagnosis not present

## 2021-01-29 DIAGNOSIS — K219 Gastro-esophageal reflux disease without esophagitis: Secondary | ICD-10-CM | POA: Diagnosis not present

## 2021-01-29 DIAGNOSIS — E876 Hypokalemia: Secondary | ICD-10-CM | POA: Diagnosis not present

## 2021-01-29 DIAGNOSIS — U071 COVID-19: Secondary | ICD-10-CM | POA: Diagnosis not present

## 2021-02-05 DIAGNOSIS — E876 Hypokalemia: Secondary | ICD-10-CM | POA: Diagnosis not present

## 2021-02-05 DIAGNOSIS — K219 Gastro-esophageal reflux disease without esophagitis: Secondary | ICD-10-CM | POA: Diagnosis not present

## 2021-02-05 DIAGNOSIS — E8881 Metabolic syndrome: Secondary | ICD-10-CM | POA: Diagnosis not present

## 2021-02-05 DIAGNOSIS — I10 Essential (primary) hypertension: Secondary | ICD-10-CM | POA: Diagnosis not present

## 2021-02-05 DIAGNOSIS — E871 Hypo-osmolality and hyponatremia: Secondary | ICD-10-CM | POA: Diagnosis not present

## 2021-02-05 DIAGNOSIS — E7800 Pure hypercholesterolemia, unspecified: Secondary | ICD-10-CM | POA: Diagnosis not present

## 2021-02-05 DIAGNOSIS — U071 COVID-19: Secondary | ICD-10-CM | POA: Diagnosis not present

## 2021-02-05 DIAGNOSIS — J1282 Pneumonia due to coronavirus disease 2019: Secondary | ICD-10-CM | POA: Diagnosis not present

## 2021-02-05 DIAGNOSIS — I480 Paroxysmal atrial fibrillation: Secondary | ICD-10-CM | POA: Diagnosis not present

## 2021-02-11 DIAGNOSIS — K219 Gastro-esophageal reflux disease without esophagitis: Secondary | ICD-10-CM | POA: Diagnosis not present

## 2021-02-11 DIAGNOSIS — I480 Paroxysmal atrial fibrillation: Secondary | ICD-10-CM | POA: Diagnosis not present

## 2021-02-11 DIAGNOSIS — E871 Hypo-osmolality and hyponatremia: Secondary | ICD-10-CM | POA: Diagnosis not present

## 2021-02-11 DIAGNOSIS — E785 Hyperlipidemia, unspecified: Secondary | ICD-10-CM | POA: Diagnosis not present

## 2021-02-11 DIAGNOSIS — B9689 Other specified bacterial agents as the cause of diseases classified elsewhere: Secondary | ICD-10-CM | POA: Diagnosis not present

## 2021-02-11 DIAGNOSIS — J1282 Pneumonia due to coronavirus disease 2019: Secondary | ICD-10-CM | POA: Diagnosis not present

## 2021-02-11 DIAGNOSIS — I2510 Atherosclerotic heart disease of native coronary artery without angina pectoris: Secondary | ICD-10-CM | POA: Diagnosis not present

## 2021-02-11 DIAGNOSIS — Z8616 Personal history of COVID-19: Secondary | ICD-10-CM | POA: Diagnosis not present

## 2021-02-11 DIAGNOSIS — U071 COVID-19: Secondary | ICD-10-CM | POA: Diagnosis not present

## 2021-02-11 DIAGNOSIS — J208 Acute bronchitis due to other specified organisms: Secondary | ICD-10-CM | POA: Diagnosis not present

## 2021-02-11 DIAGNOSIS — I340 Nonrheumatic mitral (valve) insufficiency: Secondary | ICD-10-CM | POA: Diagnosis not present

## 2021-02-11 DIAGNOSIS — I10 Essential (primary) hypertension: Secondary | ICD-10-CM | POA: Diagnosis not present

## 2021-02-11 DIAGNOSIS — E876 Hypokalemia: Secondary | ICD-10-CM | POA: Diagnosis not present

## 2021-02-12 DIAGNOSIS — I480 Paroxysmal atrial fibrillation: Secondary | ICD-10-CM | POA: Diagnosis not present

## 2021-02-18 DIAGNOSIS — K5909 Other constipation: Secondary | ICD-10-CM | POA: Diagnosis not present

## 2021-02-18 DIAGNOSIS — Z20828 Contact with and (suspected) exposure to other viral communicable diseases: Secondary | ICD-10-CM | POA: Diagnosis not present

## 2021-02-18 DIAGNOSIS — E876 Hypokalemia: Secondary | ICD-10-CM | POA: Diagnosis not present

## 2021-02-18 DIAGNOSIS — E871 Hypo-osmolality and hyponatremia: Secondary | ICD-10-CM | POA: Diagnosis not present

## 2021-02-18 DIAGNOSIS — G2581 Restless legs syndrome: Secondary | ICD-10-CM | POA: Diagnosis not present

## 2021-02-18 DIAGNOSIS — R7989 Other specified abnormal findings of blood chemistry: Secondary | ICD-10-CM | POA: Diagnosis not present

## 2021-02-18 DIAGNOSIS — J069 Acute upper respiratory infection, unspecified: Secondary | ICD-10-CM | POA: Diagnosis not present

## 2021-02-18 DIAGNOSIS — K219 Gastro-esophageal reflux disease without esophagitis: Secondary | ICD-10-CM | POA: Diagnosis not present

## 2021-02-18 DIAGNOSIS — J189 Pneumonia, unspecified organism: Secondary | ICD-10-CM | POA: Diagnosis not present

## 2021-02-18 DIAGNOSIS — E8881 Metabolic syndrome: Secondary | ICD-10-CM | POA: Diagnosis not present

## 2021-02-18 DIAGNOSIS — R051 Acute cough: Secondary | ICD-10-CM | POA: Diagnosis not present

## 2021-02-18 DIAGNOSIS — I480 Paroxysmal atrial fibrillation: Secondary | ICD-10-CM | POA: Diagnosis not present

## 2021-03-06 DIAGNOSIS — Z9181 History of falling: Secondary | ICD-10-CM | POA: Diagnosis not present

## 2021-03-06 DIAGNOSIS — E785 Hyperlipidemia, unspecified: Secondary | ICD-10-CM | POA: Diagnosis not present

## 2021-03-06 DIAGNOSIS — Z0000 Encounter for general adult medical examination without abnormal findings: Secondary | ICD-10-CM | POA: Diagnosis not present

## 2021-03-06 DIAGNOSIS — Z1331 Encounter for screening for depression: Secondary | ICD-10-CM | POA: Diagnosis not present

## 2021-03-06 DIAGNOSIS — E669 Obesity, unspecified: Secondary | ICD-10-CM | POA: Diagnosis not present

## 2021-03-15 DIAGNOSIS — E876 Hypokalemia: Secondary | ICD-10-CM | POA: Diagnosis not present

## 2021-03-15 DIAGNOSIS — R7989 Other specified abnormal findings of blood chemistry: Secondary | ICD-10-CM | POA: Diagnosis not present

## 2021-03-15 DIAGNOSIS — K219 Gastro-esophageal reflux disease without esophagitis: Secondary | ICD-10-CM | POA: Diagnosis not present

## 2021-03-15 DIAGNOSIS — I480 Paroxysmal atrial fibrillation: Secondary | ICD-10-CM | POA: Diagnosis not present

## 2021-03-15 DIAGNOSIS — E8881 Metabolic syndrome: Secondary | ICD-10-CM | POA: Diagnosis not present

## 2021-03-15 DIAGNOSIS — E871 Hypo-osmolality and hyponatremia: Secondary | ICD-10-CM | POA: Diagnosis not present

## 2021-03-15 DIAGNOSIS — M542 Cervicalgia: Secondary | ICD-10-CM | POA: Diagnosis not present

## 2021-03-15 DIAGNOSIS — K5909 Other constipation: Secondary | ICD-10-CM | POA: Diagnosis not present

## 2021-03-15 DIAGNOSIS — G2581 Restless legs syndrome: Secondary | ICD-10-CM | POA: Diagnosis not present

## 2021-04-12 DIAGNOSIS — I480 Paroxysmal atrial fibrillation: Secondary | ICD-10-CM | POA: Diagnosis not present

## 2021-04-15 DIAGNOSIS — E871 Hypo-osmolality and hyponatremia: Secondary | ICD-10-CM | POA: Diagnosis not present

## 2021-04-15 DIAGNOSIS — K219 Gastro-esophageal reflux disease without esophagitis: Secondary | ICD-10-CM | POA: Diagnosis not present

## 2021-04-15 DIAGNOSIS — M542 Cervicalgia: Secondary | ICD-10-CM | POA: Diagnosis not present

## 2021-04-15 DIAGNOSIS — I491 Atrial premature depolarization: Secondary | ICD-10-CM | POA: Diagnosis not present

## 2021-04-15 DIAGNOSIS — G2581 Restless legs syndrome: Secondary | ICD-10-CM | POA: Diagnosis not present

## 2021-04-15 DIAGNOSIS — I480 Paroxysmal atrial fibrillation: Secondary | ICD-10-CM | POA: Diagnosis not present

## 2021-04-15 DIAGNOSIS — R7989 Other specified abnormal findings of blood chemistry: Secondary | ICD-10-CM | POA: Diagnosis not present

## 2021-04-15 DIAGNOSIS — E876 Hypokalemia: Secondary | ICD-10-CM | POA: Diagnosis not present

## 2021-04-15 DIAGNOSIS — E8881 Metabolic syndrome: Secondary | ICD-10-CM | POA: Diagnosis not present

## 2021-04-15 DIAGNOSIS — I493 Ventricular premature depolarization: Secondary | ICD-10-CM | POA: Diagnosis not present

## 2021-04-15 DIAGNOSIS — K5909 Other constipation: Secondary | ICD-10-CM | POA: Diagnosis not present

## 2021-05-10 LAB — COLOGUARD

## 2021-05-10 LAB — EXTERNAL GENERIC LAB PROCEDURE

## 2021-05-13 DIAGNOSIS — G2581 Restless legs syndrome: Secondary | ICD-10-CM | POA: Diagnosis not present

## 2021-05-13 DIAGNOSIS — K219 Gastro-esophageal reflux disease without esophagitis: Secondary | ICD-10-CM | POA: Diagnosis not present

## 2021-05-13 DIAGNOSIS — E7800 Pure hypercholesterolemia, unspecified: Secondary | ICD-10-CM | POA: Diagnosis not present

## 2021-05-13 DIAGNOSIS — I480 Paroxysmal atrial fibrillation: Secondary | ICD-10-CM | POA: Diagnosis not present

## 2021-05-13 DIAGNOSIS — I10 Essential (primary) hypertension: Secondary | ICD-10-CM | POA: Diagnosis not present

## 2021-05-13 DIAGNOSIS — K5909 Other constipation: Secondary | ICD-10-CM | POA: Diagnosis not present

## 2021-05-13 DIAGNOSIS — J208 Acute bronchitis due to other specified organisms: Secondary | ICD-10-CM | POA: Diagnosis not present

## 2021-05-13 DIAGNOSIS — B9689 Other specified bacterial agents as the cause of diseases classified elsewhere: Secondary | ICD-10-CM | POA: Diagnosis not present

## 2021-05-13 DIAGNOSIS — E8881 Metabolic syndrome: Secondary | ICD-10-CM | POA: Diagnosis not present

## 2021-05-13 DIAGNOSIS — E871 Hypo-osmolality and hyponatremia: Secondary | ICD-10-CM | POA: Diagnosis not present

## 2021-05-13 DIAGNOSIS — E876 Hypokalemia: Secondary | ICD-10-CM | POA: Diagnosis not present

## 2021-06-02 DIAGNOSIS — Z1211 Encounter for screening for malignant neoplasm of colon: Secondary | ICD-10-CM | POA: Diagnosis not present

## 2021-06-02 DIAGNOSIS — Z1212 Encounter for screening for malignant neoplasm of rectum: Secondary | ICD-10-CM | POA: Diagnosis not present

## 2021-06-08 LAB — COLOGUARD: COLOGUARD: NEGATIVE

## 2021-06-08 LAB — EXTERNAL GENERIC LAB PROCEDURE: COLOGUARD: NEGATIVE

## 2021-06-10 DIAGNOSIS — J208 Acute bronchitis due to other specified organisms: Secondary | ICD-10-CM | POA: Diagnosis not present

## 2021-06-10 DIAGNOSIS — B9689 Other specified bacterial agents as the cause of diseases classified elsewhere: Secondary | ICD-10-CM | POA: Diagnosis not present

## 2021-06-10 DIAGNOSIS — K219 Gastro-esophageal reflux disease without esophagitis: Secondary | ICD-10-CM | POA: Diagnosis not present

## 2021-06-10 DIAGNOSIS — G2581 Restless legs syndrome: Secondary | ICD-10-CM | POA: Diagnosis not present

## 2021-06-10 DIAGNOSIS — I480 Paroxysmal atrial fibrillation: Secondary | ICD-10-CM | POA: Diagnosis not present

## 2021-06-10 DIAGNOSIS — R7989 Other specified abnormal findings of blood chemistry: Secondary | ICD-10-CM | POA: Diagnosis not present

## 2021-06-10 DIAGNOSIS — E871 Hypo-osmolality and hyponatremia: Secondary | ICD-10-CM | POA: Diagnosis not present

## 2021-06-10 DIAGNOSIS — E876 Hypokalemia: Secondary | ICD-10-CM | POA: Diagnosis not present

## 2021-06-10 DIAGNOSIS — K5909 Other constipation: Secondary | ICD-10-CM | POA: Diagnosis not present

## 2021-07-05 DIAGNOSIS — F3341 Major depressive disorder, recurrent, in partial remission: Secondary | ICD-10-CM | POA: Diagnosis not present

## 2021-07-05 DIAGNOSIS — R7989 Other specified abnormal findings of blood chemistry: Secondary | ICD-10-CM | POA: Diagnosis not present

## 2021-07-05 DIAGNOSIS — E876 Hypokalemia: Secondary | ICD-10-CM | POA: Diagnosis not present

## 2021-07-05 DIAGNOSIS — E871 Hypo-osmolality and hyponatremia: Secondary | ICD-10-CM | POA: Diagnosis not present

## 2021-07-05 DIAGNOSIS — B9689 Other specified bacterial agents as the cause of diseases classified elsewhere: Secondary | ICD-10-CM | POA: Diagnosis not present

## 2021-07-05 DIAGNOSIS — K219 Gastro-esophageal reflux disease without esophagitis: Secondary | ICD-10-CM | POA: Diagnosis not present

## 2021-07-05 DIAGNOSIS — G2581 Restless legs syndrome: Secondary | ICD-10-CM | POA: Diagnosis not present

## 2021-07-05 DIAGNOSIS — J208 Acute bronchitis due to other specified organisms: Secondary | ICD-10-CM | POA: Diagnosis not present

## 2021-07-05 DIAGNOSIS — K5909 Other constipation: Secondary | ICD-10-CM | POA: Diagnosis not present

## 2021-07-05 DIAGNOSIS — I480 Paroxysmal atrial fibrillation: Secondary | ICD-10-CM | POA: Diagnosis not present

## 2021-07-18 DIAGNOSIS — K5909 Other constipation: Secondary | ICD-10-CM | POA: Diagnosis not present

## 2021-07-18 DIAGNOSIS — E8881 Metabolic syndrome: Secondary | ICD-10-CM | POA: Diagnosis not present

## 2021-07-18 DIAGNOSIS — I480 Paroxysmal atrial fibrillation: Secondary | ICD-10-CM | POA: Diagnosis not present

## 2021-07-18 DIAGNOSIS — E871 Hypo-osmolality and hyponatremia: Secondary | ICD-10-CM | POA: Diagnosis not present

## 2021-07-18 DIAGNOSIS — G2581 Restless legs syndrome: Secondary | ICD-10-CM | POA: Diagnosis not present

## 2021-07-18 DIAGNOSIS — K219 Gastro-esophageal reflux disease without esophagitis: Secondary | ICD-10-CM | POA: Diagnosis not present

## 2021-07-18 DIAGNOSIS — M25562 Pain in left knee: Secondary | ICD-10-CM | POA: Diagnosis not present

## 2021-07-18 DIAGNOSIS — R7989 Other specified abnormal findings of blood chemistry: Secondary | ICD-10-CM | POA: Diagnosis not present

## 2021-07-18 DIAGNOSIS — E876 Hypokalemia: Secondary | ICD-10-CM | POA: Diagnosis not present

## 2021-07-19 DIAGNOSIS — I480 Paroxysmal atrial fibrillation: Secondary | ICD-10-CM | POA: Diagnosis not present

## 2021-07-19 DIAGNOSIS — E871 Hypo-osmolality and hyponatremia: Secondary | ICD-10-CM | POA: Diagnosis not present

## 2021-07-19 DIAGNOSIS — B9689 Other specified bacterial agents as the cause of diseases classified elsewhere: Secondary | ICD-10-CM | POA: Diagnosis not present

## 2021-07-19 DIAGNOSIS — J208 Acute bronchitis due to other specified organisms: Secondary | ICD-10-CM | POA: Diagnosis not present

## 2021-07-19 DIAGNOSIS — G8929 Other chronic pain: Secondary | ICD-10-CM | POA: Diagnosis not present

## 2021-07-19 DIAGNOSIS — G4700 Insomnia, unspecified: Secondary | ICD-10-CM | POA: Diagnosis not present

## 2021-07-19 DIAGNOSIS — E876 Hypokalemia: Secondary | ICD-10-CM | POA: Diagnosis not present

## 2021-07-19 DIAGNOSIS — M25562 Pain in left knee: Secondary | ICD-10-CM | POA: Diagnosis not present

## 2021-07-19 DIAGNOSIS — M542 Cervicalgia: Secondary | ICD-10-CM | POA: Diagnosis not present

## 2021-08-02 DIAGNOSIS — K5909 Other constipation: Secondary | ICD-10-CM | POA: Diagnosis not present

## 2021-08-02 DIAGNOSIS — G4700 Insomnia, unspecified: Secondary | ICD-10-CM | POA: Diagnosis not present

## 2021-08-02 DIAGNOSIS — M542 Cervicalgia: Secondary | ICD-10-CM | POA: Diagnosis not present

## 2021-08-02 DIAGNOSIS — I480 Paroxysmal atrial fibrillation: Secondary | ICD-10-CM | POA: Diagnosis not present

## 2021-08-02 DIAGNOSIS — E876 Hypokalemia: Secondary | ICD-10-CM | POA: Diagnosis not present

## 2021-08-02 DIAGNOSIS — E8881 Metabolic syndrome: Secondary | ICD-10-CM | POA: Diagnosis not present

## 2021-08-02 DIAGNOSIS — E871 Hypo-osmolality and hyponatremia: Secondary | ICD-10-CM | POA: Diagnosis not present

## 2021-08-02 DIAGNOSIS — G8929 Other chronic pain: Secondary | ICD-10-CM | POA: Diagnosis not present

## 2021-08-02 DIAGNOSIS — K219 Gastro-esophageal reflux disease without esophagitis: Secondary | ICD-10-CM | POA: Diagnosis not present

## 2021-08-30 DIAGNOSIS — I480 Paroxysmal atrial fibrillation: Secondary | ICD-10-CM | POA: Diagnosis not present

## 2021-08-30 DIAGNOSIS — E876 Hypokalemia: Secondary | ICD-10-CM | POA: Diagnosis not present

## 2021-08-30 DIAGNOSIS — G8929 Other chronic pain: Secondary | ICD-10-CM | POA: Diagnosis not present

## 2021-08-30 DIAGNOSIS — G4700 Insomnia, unspecified: Secondary | ICD-10-CM | POA: Diagnosis not present

## 2021-08-30 DIAGNOSIS — K219 Gastro-esophageal reflux disease without esophagitis: Secondary | ICD-10-CM | POA: Diagnosis not present

## 2021-08-30 DIAGNOSIS — E7800 Pure hypercholesterolemia, unspecified: Secondary | ICD-10-CM | POA: Diagnosis not present

## 2021-08-30 DIAGNOSIS — I10 Essential (primary) hypertension: Secondary | ICD-10-CM | POA: Diagnosis not present

## 2021-08-30 DIAGNOSIS — M542 Cervicalgia: Secondary | ICD-10-CM | POA: Diagnosis not present

## 2021-08-30 DIAGNOSIS — E8881 Metabolic syndrome: Secondary | ICD-10-CM | POA: Diagnosis not present

## 2021-08-30 DIAGNOSIS — E871 Hypo-osmolality and hyponatremia: Secondary | ICD-10-CM | POA: Diagnosis not present

## 2021-09-27 DIAGNOSIS — G8929 Other chronic pain: Secondary | ICD-10-CM | POA: Diagnosis not present

## 2021-09-27 DIAGNOSIS — Z23 Encounter for immunization: Secondary | ICD-10-CM | POA: Diagnosis not present

## 2021-09-27 DIAGNOSIS — M542 Cervicalgia: Secondary | ICD-10-CM | POA: Diagnosis not present

## 2021-09-27 DIAGNOSIS — I480 Paroxysmal atrial fibrillation: Secondary | ICD-10-CM | POA: Diagnosis not present

## 2021-09-27 DIAGNOSIS — E7800 Pure hypercholesterolemia, unspecified: Secondary | ICD-10-CM | POA: Diagnosis not present

## 2021-09-27 DIAGNOSIS — K219 Gastro-esophageal reflux disease without esophagitis: Secondary | ICD-10-CM | POA: Diagnosis not present

## 2021-09-27 DIAGNOSIS — E871 Hypo-osmolality and hyponatremia: Secondary | ICD-10-CM | POA: Diagnosis not present

## 2021-09-27 DIAGNOSIS — E876 Hypokalemia: Secondary | ICD-10-CM | POA: Diagnosis not present

## 2021-09-27 DIAGNOSIS — G4700 Insomnia, unspecified: Secondary | ICD-10-CM | POA: Diagnosis not present

## 2021-10-25 DIAGNOSIS — E7800 Pure hypercholesterolemia, unspecified: Secondary | ICD-10-CM | POA: Diagnosis not present

## 2021-10-25 DIAGNOSIS — G4700 Insomnia, unspecified: Secondary | ICD-10-CM | POA: Diagnosis not present

## 2021-10-25 DIAGNOSIS — B9689 Other specified bacterial agents as the cause of diseases classified elsewhere: Secondary | ICD-10-CM | POA: Diagnosis not present

## 2021-10-25 DIAGNOSIS — M542 Cervicalgia: Secondary | ICD-10-CM | POA: Diagnosis not present

## 2021-10-25 DIAGNOSIS — J208 Acute bronchitis due to other specified organisms: Secondary | ICD-10-CM | POA: Diagnosis not present

## 2021-10-25 DIAGNOSIS — E876 Hypokalemia: Secondary | ICD-10-CM | POA: Diagnosis not present

## 2021-10-25 DIAGNOSIS — I480 Paroxysmal atrial fibrillation: Secondary | ICD-10-CM | POA: Diagnosis not present

## 2021-10-25 DIAGNOSIS — E871 Hypo-osmolality and hyponatremia: Secondary | ICD-10-CM | POA: Diagnosis not present

## 2021-10-25 DIAGNOSIS — G8929 Other chronic pain: Secondary | ICD-10-CM | POA: Diagnosis not present

## 2021-11-22 DIAGNOSIS — M542 Cervicalgia: Secondary | ICD-10-CM | POA: Diagnosis not present

## 2021-11-22 DIAGNOSIS — E876 Hypokalemia: Secondary | ICD-10-CM | POA: Diagnosis not present

## 2021-11-22 DIAGNOSIS — E871 Hypo-osmolality and hyponatremia: Secondary | ICD-10-CM | POA: Diagnosis not present

## 2021-11-22 DIAGNOSIS — M25562 Pain in left knee: Secondary | ICD-10-CM | POA: Diagnosis not present

## 2021-11-22 DIAGNOSIS — G8929 Other chronic pain: Secondary | ICD-10-CM | POA: Diagnosis not present

## 2021-11-22 DIAGNOSIS — E7800 Pure hypercholesterolemia, unspecified: Secondary | ICD-10-CM | POA: Diagnosis not present

## 2021-11-22 DIAGNOSIS — K219 Gastro-esophageal reflux disease without esophagitis: Secondary | ICD-10-CM | POA: Diagnosis not present

## 2021-11-22 DIAGNOSIS — I480 Paroxysmal atrial fibrillation: Secondary | ICD-10-CM | POA: Diagnosis not present

## 2021-11-22 DIAGNOSIS — G4700 Insomnia, unspecified: Secondary | ICD-10-CM | POA: Diagnosis not present

## 2021-12-20 DIAGNOSIS — K219 Gastro-esophageal reflux disease without esophagitis: Secondary | ICD-10-CM | POA: Diagnosis not present

## 2021-12-20 DIAGNOSIS — M542 Cervicalgia: Secondary | ICD-10-CM | POA: Diagnosis not present

## 2021-12-20 DIAGNOSIS — I480 Paroxysmal atrial fibrillation: Secondary | ICD-10-CM | POA: Diagnosis not present

## 2021-12-20 DIAGNOSIS — Z6836 Body mass index (BMI) 36.0-36.9, adult: Secondary | ICD-10-CM | POA: Diagnosis not present

## 2021-12-20 DIAGNOSIS — G4700 Insomnia, unspecified: Secondary | ICD-10-CM | POA: Diagnosis not present

## 2021-12-20 DIAGNOSIS — G8929 Other chronic pain: Secondary | ICD-10-CM | POA: Diagnosis not present

## 2021-12-20 DIAGNOSIS — E7800 Pure hypercholesterolemia, unspecified: Secondary | ICD-10-CM | POA: Diagnosis not present

## 2021-12-20 DIAGNOSIS — E871 Hypo-osmolality and hyponatremia: Secondary | ICD-10-CM | POA: Diagnosis not present

## 2021-12-20 DIAGNOSIS — E876 Hypokalemia: Secondary | ICD-10-CM | POA: Diagnosis not present

## 2022-01-17 DIAGNOSIS — G4700 Insomnia, unspecified: Secondary | ICD-10-CM | POA: Diagnosis not present

## 2022-01-17 DIAGNOSIS — I480 Paroxysmal atrial fibrillation: Secondary | ICD-10-CM | POA: Diagnosis not present

## 2022-01-17 DIAGNOSIS — K219 Gastro-esophageal reflux disease without esophagitis: Secondary | ICD-10-CM | POA: Diagnosis not present

## 2022-01-17 DIAGNOSIS — E876 Hypokalemia: Secondary | ICD-10-CM | POA: Diagnosis not present

## 2022-01-17 DIAGNOSIS — E7800 Pure hypercholesterolemia, unspecified: Secondary | ICD-10-CM | POA: Diagnosis not present

## 2022-01-17 DIAGNOSIS — I10 Essential (primary) hypertension: Secondary | ICD-10-CM | POA: Diagnosis not present

## 2022-01-17 DIAGNOSIS — M25562 Pain in left knee: Secondary | ICD-10-CM | POA: Diagnosis not present

## 2022-01-17 DIAGNOSIS — E871 Hypo-osmolality and hyponatremia: Secondary | ICD-10-CM | POA: Diagnosis not present

## 2022-01-17 DIAGNOSIS — G8929 Other chronic pain: Secondary | ICD-10-CM | POA: Diagnosis not present

## 2022-01-17 DIAGNOSIS — M542 Cervicalgia: Secondary | ICD-10-CM | POA: Diagnosis not present

## 2022-02-14 DIAGNOSIS — I10 Essential (primary) hypertension: Secondary | ICD-10-CM | POA: Diagnosis not present

## 2022-02-14 DIAGNOSIS — G8929 Other chronic pain: Secondary | ICD-10-CM | POA: Diagnosis not present

## 2022-02-14 DIAGNOSIS — Z6836 Body mass index (BMI) 36.0-36.9, adult: Secondary | ICD-10-CM | POA: Diagnosis not present

## 2022-02-14 DIAGNOSIS — M542 Cervicalgia: Secondary | ICD-10-CM | POA: Diagnosis not present

## 2022-02-14 DIAGNOSIS — G4700 Insomnia, unspecified: Secondary | ICD-10-CM | POA: Diagnosis not present

## 2022-03-11 DIAGNOSIS — Z0000 Encounter for general adult medical examination without abnormal findings: Secondary | ICD-10-CM | POA: Diagnosis not present

## 2022-03-11 DIAGNOSIS — Z1331 Encounter for screening for depression: Secondary | ICD-10-CM | POA: Diagnosis not present

## 2022-03-11 DIAGNOSIS — E785 Hyperlipidemia, unspecified: Secondary | ICD-10-CM | POA: Diagnosis not present

## 2022-03-11 DIAGNOSIS — Z9181 History of falling: Secondary | ICD-10-CM | POA: Diagnosis not present

## 2022-03-13 DIAGNOSIS — E8881 Metabolic syndrome: Secondary | ICD-10-CM | POA: Diagnosis not present

## 2022-03-13 DIAGNOSIS — I10 Essential (primary) hypertension: Secondary | ICD-10-CM | POA: Diagnosis not present

## 2022-03-13 DIAGNOSIS — Z139 Encounter for screening, unspecified: Secondary | ICD-10-CM | POA: Diagnosis not present

## 2022-03-13 DIAGNOSIS — K5909 Other constipation: Secondary | ICD-10-CM | POA: Diagnosis not present

## 2022-03-13 DIAGNOSIS — G8929 Other chronic pain: Secondary | ICD-10-CM | POA: Diagnosis not present

## 2022-03-13 DIAGNOSIS — G4700 Insomnia, unspecified: Secondary | ICD-10-CM | POA: Diagnosis not present

## 2022-03-13 DIAGNOSIS — M542 Cervicalgia: Secondary | ICD-10-CM | POA: Diagnosis not present

## 2022-03-13 DIAGNOSIS — I480 Paroxysmal atrial fibrillation: Secondary | ICD-10-CM | POA: Diagnosis not present

## 2022-03-13 DIAGNOSIS — E7800 Pure hypercholesterolemia, unspecified: Secondary | ICD-10-CM | POA: Diagnosis not present

## 2022-04-10 DIAGNOSIS — I480 Paroxysmal atrial fibrillation: Secondary | ICD-10-CM | POA: Diagnosis not present

## 2022-04-10 DIAGNOSIS — G8929 Other chronic pain: Secondary | ICD-10-CM | POA: Diagnosis not present

## 2022-04-10 DIAGNOSIS — G4700 Insomnia, unspecified: Secondary | ICD-10-CM | POA: Diagnosis not present

## 2022-04-10 DIAGNOSIS — M542 Cervicalgia: Secondary | ICD-10-CM | POA: Diagnosis not present

## 2022-04-10 DIAGNOSIS — F3341 Major depressive disorder, recurrent, in partial remission: Secondary | ICD-10-CM | POA: Diagnosis not present

## 2022-04-10 DIAGNOSIS — E7800 Pure hypercholesterolemia, unspecified: Secondary | ICD-10-CM | POA: Diagnosis not present

## 2022-04-10 DIAGNOSIS — K5909 Other constipation: Secondary | ICD-10-CM | POA: Diagnosis not present

## 2022-04-10 DIAGNOSIS — I10 Essential (primary) hypertension: Secondary | ICD-10-CM | POA: Diagnosis not present

## 2022-04-10 DIAGNOSIS — K219 Gastro-esophageal reflux disease without esophagitis: Secondary | ICD-10-CM | POA: Diagnosis not present

## 2022-04-10 DIAGNOSIS — E8881 Metabolic syndrome: Secondary | ICD-10-CM | POA: Diagnosis not present

## 2022-04-19 DIAGNOSIS — R0602 Shortness of breath: Secondary | ICD-10-CM | POA: Diagnosis not present

## 2022-04-19 DIAGNOSIS — R079 Chest pain, unspecified: Secondary | ICD-10-CM | POA: Diagnosis not present

## 2022-04-19 DIAGNOSIS — K8020 Calculus of gallbladder without cholecystitis without obstruction: Secondary | ICD-10-CM | POA: Diagnosis not present

## 2022-04-19 DIAGNOSIS — I249 Acute ischemic heart disease, unspecified: Secondary | ICD-10-CM | POA: Diagnosis not present

## 2022-04-25 DIAGNOSIS — I480 Paroxysmal atrial fibrillation: Secondary | ICD-10-CM | POA: Diagnosis not present

## 2022-04-25 DIAGNOSIS — I10 Essential (primary) hypertension: Secondary | ICD-10-CM | POA: Diagnosis not present

## 2022-04-25 DIAGNOSIS — R1310 Dysphagia, unspecified: Secondary | ICD-10-CM | POA: Diagnosis not present

## 2022-04-25 DIAGNOSIS — K5909 Other constipation: Secondary | ICD-10-CM | POA: Diagnosis not present

## 2022-04-25 DIAGNOSIS — G8929 Other chronic pain: Secondary | ICD-10-CM | POA: Diagnosis not present

## 2022-04-25 DIAGNOSIS — E7800 Pure hypercholesterolemia, unspecified: Secondary | ICD-10-CM | POA: Diagnosis not present

## 2022-04-25 DIAGNOSIS — K219 Gastro-esophageal reflux disease without esophagitis: Secondary | ICD-10-CM | POA: Diagnosis not present

## 2022-04-25 DIAGNOSIS — G4700 Insomnia, unspecified: Secondary | ICD-10-CM | POA: Diagnosis not present

## 2022-04-25 DIAGNOSIS — M542 Cervicalgia: Secondary | ICD-10-CM | POA: Diagnosis not present

## 2022-05-09 DIAGNOSIS — K219 Gastro-esophageal reflux disease without esophagitis: Secondary | ICD-10-CM | POA: Diagnosis not present

## 2022-05-09 DIAGNOSIS — M542 Cervicalgia: Secondary | ICD-10-CM | POA: Diagnosis not present

## 2022-05-09 DIAGNOSIS — I480 Paroxysmal atrial fibrillation: Secondary | ICD-10-CM | POA: Diagnosis not present

## 2022-05-09 DIAGNOSIS — G8929 Other chronic pain: Secondary | ICD-10-CM | POA: Diagnosis not present

## 2022-05-09 DIAGNOSIS — Z6835 Body mass index (BMI) 35.0-35.9, adult: Secondary | ICD-10-CM | POA: Diagnosis not present

## 2022-05-09 DIAGNOSIS — E7800 Pure hypercholesterolemia, unspecified: Secondary | ICD-10-CM | POA: Diagnosis not present

## 2022-05-09 DIAGNOSIS — K5909 Other constipation: Secondary | ICD-10-CM | POA: Diagnosis not present

## 2022-05-09 DIAGNOSIS — G4700 Insomnia, unspecified: Secondary | ICD-10-CM | POA: Diagnosis not present

## 2022-05-09 DIAGNOSIS — I10 Essential (primary) hypertension: Secondary | ICD-10-CM | POA: Diagnosis not present

## 2022-06-06 DIAGNOSIS — E7800 Pure hypercholesterolemia, unspecified: Secondary | ICD-10-CM | POA: Diagnosis not present

## 2022-06-06 DIAGNOSIS — I480 Paroxysmal atrial fibrillation: Secondary | ICD-10-CM | POA: Diagnosis not present

## 2022-06-06 DIAGNOSIS — I10 Essential (primary) hypertension: Secondary | ICD-10-CM | POA: Diagnosis not present

## 2022-06-06 DIAGNOSIS — G4700 Insomnia, unspecified: Secondary | ICD-10-CM | POA: Diagnosis not present

## 2022-06-06 DIAGNOSIS — G8929 Other chronic pain: Secondary | ICD-10-CM | POA: Diagnosis not present

## 2022-06-06 DIAGNOSIS — J208 Acute bronchitis due to other specified organisms: Secondary | ICD-10-CM | POA: Diagnosis not present

## 2022-06-06 DIAGNOSIS — M542 Cervicalgia: Secondary | ICD-10-CM | POA: Diagnosis not present

## 2022-06-06 DIAGNOSIS — K5909 Other constipation: Secondary | ICD-10-CM | POA: Diagnosis not present

## 2022-06-06 DIAGNOSIS — K219 Gastro-esophageal reflux disease without esophagitis: Secondary | ICD-10-CM | POA: Diagnosis not present

## 2022-06-19 DIAGNOSIS — K219 Gastro-esophageal reflux disease without esophagitis: Secondary | ICD-10-CM | POA: Diagnosis not present

## 2022-06-19 DIAGNOSIS — R1310 Dysphagia, unspecified: Secondary | ICD-10-CM | POA: Diagnosis not present

## 2022-06-19 DIAGNOSIS — K5900 Constipation, unspecified: Secondary | ICD-10-CM | POA: Diagnosis not present

## 2022-07-04 DIAGNOSIS — I10 Essential (primary) hypertension: Secondary | ICD-10-CM | POA: Diagnosis not present

## 2022-07-04 DIAGNOSIS — E7800 Pure hypercholesterolemia, unspecified: Secondary | ICD-10-CM | POA: Diagnosis not present

## 2022-07-04 DIAGNOSIS — M542 Cervicalgia: Secondary | ICD-10-CM | POA: Diagnosis not present

## 2022-07-04 DIAGNOSIS — G8929 Other chronic pain: Secondary | ICD-10-CM | POA: Diagnosis not present

## 2022-07-04 DIAGNOSIS — J208 Acute bronchitis due to other specified organisms: Secondary | ICD-10-CM | POA: Diagnosis not present

## 2022-07-04 DIAGNOSIS — K219 Gastro-esophageal reflux disease without esophagitis: Secondary | ICD-10-CM | POA: Diagnosis not present

## 2022-07-04 DIAGNOSIS — I480 Paroxysmal atrial fibrillation: Secondary | ICD-10-CM | POA: Diagnosis not present

## 2022-07-04 DIAGNOSIS — K5909 Other constipation: Secondary | ICD-10-CM | POA: Diagnosis not present

## 2022-07-04 DIAGNOSIS — G4700 Insomnia, unspecified: Secondary | ICD-10-CM | POA: Diagnosis not present

## 2022-07-29 DIAGNOSIS — K635 Polyp of colon: Secondary | ICD-10-CM | POA: Diagnosis not present

## 2022-07-29 DIAGNOSIS — Z1211 Encounter for screening for malignant neoplasm of colon: Secondary | ICD-10-CM | POA: Diagnosis not present

## 2022-07-29 DIAGNOSIS — K222 Esophageal obstruction: Secondary | ICD-10-CM | POA: Diagnosis not present

## 2022-07-29 DIAGNOSIS — K219 Gastro-esophageal reflux disease without esophagitis: Secondary | ICD-10-CM | POA: Diagnosis not present

## 2022-07-29 DIAGNOSIS — D126 Benign neoplasm of colon, unspecified: Secondary | ICD-10-CM | POA: Diagnosis not present

## 2022-07-29 DIAGNOSIS — R1310 Dysphagia, unspecified: Secondary | ICD-10-CM | POA: Diagnosis not present

## 2022-07-29 DIAGNOSIS — D649 Anemia, unspecified: Secondary | ICD-10-CM | POA: Diagnosis not present

## 2022-07-29 DIAGNOSIS — D131 Benign neoplasm of stomach: Secondary | ICD-10-CM | POA: Diagnosis not present

## 2022-07-29 DIAGNOSIS — K317 Polyp of stomach and duodenum: Secondary | ICD-10-CM | POA: Diagnosis not present

## 2022-07-29 DIAGNOSIS — K648 Other hemorrhoids: Secondary | ICD-10-CM | POA: Diagnosis not present

## 2022-08-01 DIAGNOSIS — K219 Gastro-esophageal reflux disease without esophagitis: Secondary | ICD-10-CM | POA: Diagnosis not present

## 2022-08-01 DIAGNOSIS — I10 Essential (primary) hypertension: Secondary | ICD-10-CM | POA: Diagnosis not present

## 2022-08-01 DIAGNOSIS — G4700 Insomnia, unspecified: Secondary | ICD-10-CM | POA: Diagnosis not present

## 2022-08-01 DIAGNOSIS — I480 Paroxysmal atrial fibrillation: Secondary | ICD-10-CM | POA: Diagnosis not present

## 2022-08-01 DIAGNOSIS — K5909 Other constipation: Secondary | ICD-10-CM | POA: Diagnosis not present

## 2022-08-01 DIAGNOSIS — M542 Cervicalgia: Secondary | ICD-10-CM | POA: Diagnosis not present

## 2022-08-01 DIAGNOSIS — Z6835 Body mass index (BMI) 35.0-35.9, adult: Secondary | ICD-10-CM | POA: Diagnosis not present

## 2022-08-01 DIAGNOSIS — G8929 Other chronic pain: Secondary | ICD-10-CM | POA: Diagnosis not present

## 2022-08-01 DIAGNOSIS — E7800 Pure hypercholesterolemia, unspecified: Secondary | ICD-10-CM | POA: Diagnosis not present

## 2022-08-29 DIAGNOSIS — M549 Dorsalgia, unspecified: Secondary | ICD-10-CM | POA: Diagnosis not present

## 2022-08-29 DIAGNOSIS — B9689 Other specified bacterial agents as the cause of diseases classified elsewhere: Secondary | ICD-10-CM | POA: Diagnosis not present

## 2022-08-29 DIAGNOSIS — J208 Acute bronchitis due to other specified organisms: Secondary | ICD-10-CM | POA: Diagnosis not present

## 2022-08-29 DIAGNOSIS — K219 Gastro-esophageal reflux disease without esophagitis: Secondary | ICD-10-CM | POA: Diagnosis not present

## 2022-08-29 DIAGNOSIS — I10 Essential (primary) hypertension: Secondary | ICD-10-CM | POA: Diagnosis not present

## 2022-08-29 DIAGNOSIS — I480 Paroxysmal atrial fibrillation: Secondary | ICD-10-CM | POA: Diagnosis not present

## 2022-08-29 DIAGNOSIS — G8929 Other chronic pain: Secondary | ICD-10-CM | POA: Diagnosis not present

## 2022-08-29 DIAGNOSIS — R5382 Chronic fatigue, unspecified: Secondary | ICD-10-CM | POA: Diagnosis not present

## 2022-08-29 DIAGNOSIS — F5104 Psychophysiologic insomnia: Secondary | ICD-10-CM | POA: Diagnosis not present

## 2022-09-26 DIAGNOSIS — K219 Gastro-esophageal reflux disease without esophagitis: Secondary | ICD-10-CM | POA: Diagnosis not present

## 2022-09-26 DIAGNOSIS — E7800 Pure hypercholesterolemia, unspecified: Secondary | ICD-10-CM | POA: Diagnosis not present

## 2022-09-26 DIAGNOSIS — I480 Paroxysmal atrial fibrillation: Secondary | ICD-10-CM | POA: Diagnosis not present

## 2022-09-26 DIAGNOSIS — G8929 Other chronic pain: Secondary | ICD-10-CM | POA: Diagnosis not present

## 2022-09-26 DIAGNOSIS — F5104 Psychophysiologic insomnia: Secondary | ICD-10-CM | POA: Diagnosis not present

## 2022-09-26 DIAGNOSIS — I10 Essential (primary) hypertension: Secondary | ICD-10-CM | POA: Diagnosis not present

## 2022-09-26 DIAGNOSIS — J208 Acute bronchitis due to other specified organisms: Secondary | ICD-10-CM | POA: Diagnosis not present

## 2022-09-26 DIAGNOSIS — B9689 Other specified bacterial agents as the cause of diseases classified elsewhere: Secondary | ICD-10-CM | POA: Diagnosis not present

## 2022-09-26 DIAGNOSIS — M549 Dorsalgia, unspecified: Secondary | ICD-10-CM | POA: Diagnosis not present

## 2022-10-20 DIAGNOSIS — F411 Generalized anxiety disorder: Secondary | ICD-10-CM | POA: Diagnosis not present

## 2022-10-20 DIAGNOSIS — M7989 Other specified soft tissue disorders: Secondary | ICD-10-CM | POA: Diagnosis not present

## 2022-10-20 DIAGNOSIS — G8929 Other chronic pain: Secondary | ICD-10-CM | POA: Diagnosis not present

## 2022-10-20 DIAGNOSIS — K449 Diaphragmatic hernia without obstruction or gangrene: Secondary | ICD-10-CM | POA: Diagnosis not present

## 2022-10-20 DIAGNOSIS — M549 Dorsalgia, unspecified: Secondary | ICD-10-CM | POA: Diagnosis not present

## 2022-10-20 DIAGNOSIS — M79662 Pain in left lower leg: Secondary | ICD-10-CM | POA: Diagnosis not present

## 2022-10-20 DIAGNOSIS — M5430 Sciatica, unspecified side: Secondary | ICD-10-CM | POA: Diagnosis not present

## 2022-10-20 DIAGNOSIS — R600 Localized edema: Secondary | ICD-10-CM | POA: Diagnosis not present

## 2022-10-20 DIAGNOSIS — Z79899 Other long term (current) drug therapy: Secondary | ICD-10-CM | POA: Diagnosis not present

## 2022-10-20 DIAGNOSIS — R079 Chest pain, unspecified: Secondary | ICD-10-CM | POA: Diagnosis not present

## 2022-10-24 DIAGNOSIS — E7800 Pure hypercholesterolemia, unspecified: Secondary | ICD-10-CM | POA: Diagnosis not present

## 2022-10-24 DIAGNOSIS — F5104 Psychophysiologic insomnia: Secondary | ICD-10-CM | POA: Diagnosis not present

## 2022-10-24 DIAGNOSIS — M549 Dorsalgia, unspecified: Secondary | ICD-10-CM | POA: Diagnosis not present

## 2022-10-24 DIAGNOSIS — I10 Essential (primary) hypertension: Secondary | ICD-10-CM | POA: Diagnosis not present

## 2022-10-24 DIAGNOSIS — I480 Paroxysmal atrial fibrillation: Secondary | ICD-10-CM | POA: Diagnosis not present

## 2022-10-24 DIAGNOSIS — G8929 Other chronic pain: Secondary | ICD-10-CM | POA: Diagnosis not present

## 2022-10-24 DIAGNOSIS — Z23 Encounter for immunization: Secondary | ICD-10-CM | POA: Diagnosis not present

## 2022-10-24 DIAGNOSIS — K5909 Other constipation: Secondary | ICD-10-CM | POA: Diagnosis not present

## 2022-10-24 DIAGNOSIS — K219 Gastro-esophageal reflux disease without esophagitis: Secondary | ICD-10-CM | POA: Diagnosis not present

## 2022-11-21 DIAGNOSIS — I480 Paroxysmal atrial fibrillation: Secondary | ICD-10-CM | POA: Diagnosis not present

## 2022-11-21 DIAGNOSIS — K219 Gastro-esophageal reflux disease without esophagitis: Secondary | ICD-10-CM | POA: Diagnosis not present

## 2022-11-21 DIAGNOSIS — I10 Essential (primary) hypertension: Secondary | ICD-10-CM | POA: Diagnosis not present

## 2022-11-21 DIAGNOSIS — K5909 Other constipation: Secondary | ICD-10-CM | POA: Diagnosis not present

## 2022-11-21 DIAGNOSIS — G8929 Other chronic pain: Secondary | ICD-10-CM | POA: Diagnosis not present

## 2022-11-21 DIAGNOSIS — E8881 Metabolic syndrome: Secondary | ICD-10-CM | POA: Diagnosis not present

## 2022-11-21 DIAGNOSIS — M549 Dorsalgia, unspecified: Secondary | ICD-10-CM | POA: Diagnosis not present

## 2022-11-21 DIAGNOSIS — E7800 Pure hypercholesterolemia, unspecified: Secondary | ICD-10-CM | POA: Diagnosis not present

## 2022-11-21 DIAGNOSIS — F5104 Psychophysiologic insomnia: Secondary | ICD-10-CM | POA: Diagnosis not present

## 2022-12-19 DIAGNOSIS — K5909 Other constipation: Secondary | ICD-10-CM | POA: Diagnosis not present

## 2022-12-19 DIAGNOSIS — I480 Paroxysmal atrial fibrillation: Secondary | ICD-10-CM | POA: Diagnosis not present

## 2022-12-19 DIAGNOSIS — M549 Dorsalgia, unspecified: Secondary | ICD-10-CM | POA: Diagnosis not present

## 2022-12-19 DIAGNOSIS — F5104 Psychophysiologic insomnia: Secondary | ICD-10-CM | POA: Diagnosis not present

## 2022-12-19 DIAGNOSIS — I10 Essential (primary) hypertension: Secondary | ICD-10-CM | POA: Diagnosis not present

## 2022-12-19 DIAGNOSIS — G8929 Other chronic pain: Secondary | ICD-10-CM | POA: Diagnosis not present

## 2022-12-19 DIAGNOSIS — K219 Gastro-esophageal reflux disease without esophagitis: Secondary | ICD-10-CM | POA: Diagnosis not present

## 2022-12-19 DIAGNOSIS — J069 Acute upper respiratory infection, unspecified: Secondary | ICD-10-CM | POA: Diagnosis not present

## 2022-12-19 DIAGNOSIS — E7800 Pure hypercholesterolemia, unspecified: Secondary | ICD-10-CM | POA: Diagnosis not present

## 2023-01-16 DIAGNOSIS — M549 Dorsalgia, unspecified: Secondary | ICD-10-CM | POA: Diagnosis not present

## 2023-01-16 DIAGNOSIS — G8929 Other chronic pain: Secondary | ICD-10-CM | POA: Diagnosis not present

## 2023-01-16 DIAGNOSIS — K219 Gastro-esophageal reflux disease without esophagitis: Secondary | ICD-10-CM | POA: Diagnosis not present

## 2023-01-16 DIAGNOSIS — K5909 Other constipation: Secondary | ICD-10-CM | POA: Diagnosis not present

## 2023-01-16 DIAGNOSIS — F3341 Major depressive disorder, recurrent, in partial remission: Secondary | ICD-10-CM | POA: Diagnosis not present

## 2023-01-16 DIAGNOSIS — F5104 Psychophysiologic insomnia: Secondary | ICD-10-CM | POA: Diagnosis not present

## 2023-01-16 DIAGNOSIS — I480 Paroxysmal atrial fibrillation: Secondary | ICD-10-CM | POA: Diagnosis not present

## 2023-01-16 DIAGNOSIS — I10 Essential (primary) hypertension: Secondary | ICD-10-CM | POA: Diagnosis not present

## 2023-01-16 DIAGNOSIS — E7800 Pure hypercholesterolemia, unspecified: Secondary | ICD-10-CM | POA: Diagnosis not present

## 2023-01-16 DIAGNOSIS — J069 Acute upper respiratory infection, unspecified: Secondary | ICD-10-CM | POA: Diagnosis not present

## 2023-02-07 DIAGNOSIS — K5909 Other constipation: Secondary | ICD-10-CM | POA: Diagnosis not present

## 2023-02-07 DIAGNOSIS — M25552 Pain in left hip: Secondary | ICD-10-CM | POA: Diagnosis not present

## 2023-02-07 DIAGNOSIS — G8929 Other chronic pain: Secondary | ICD-10-CM | POA: Diagnosis not present

## 2023-02-07 DIAGNOSIS — K219 Gastro-esophageal reflux disease without esophagitis: Secondary | ICD-10-CM | POA: Diagnosis not present

## 2023-02-07 DIAGNOSIS — M549 Dorsalgia, unspecified: Secondary | ICD-10-CM | POA: Diagnosis not present

## 2023-02-07 DIAGNOSIS — I480 Paroxysmal atrial fibrillation: Secondary | ICD-10-CM | POA: Diagnosis not present

## 2023-02-07 DIAGNOSIS — E7800 Pure hypercholesterolemia, unspecified: Secondary | ICD-10-CM | POA: Diagnosis not present

## 2023-02-07 DIAGNOSIS — F5104 Psychophysiologic insomnia: Secondary | ICD-10-CM | POA: Diagnosis not present

## 2023-02-07 DIAGNOSIS — I10 Essential (primary) hypertension: Secondary | ICD-10-CM | POA: Diagnosis not present

## 2023-02-08 DIAGNOSIS — M4316 Spondylolisthesis, lumbar region: Secondary | ICD-10-CM | POA: Diagnosis not present

## 2023-02-08 DIAGNOSIS — M545 Low back pain, unspecified: Secondary | ICD-10-CM | POA: Diagnosis not present

## 2023-02-08 DIAGNOSIS — M1611 Unilateral primary osteoarthritis, right hip: Secondary | ICD-10-CM | POA: Diagnosis not present

## 2023-02-08 DIAGNOSIS — M25551 Pain in right hip: Secondary | ICD-10-CM | POA: Diagnosis not present

## 2023-02-08 DIAGNOSIS — M25561 Pain in right knee: Secondary | ICD-10-CM | POA: Diagnosis not present

## 2023-02-08 DIAGNOSIS — M5136 Other intervertebral disc degeneration, lumbar region: Secondary | ICD-10-CM | POA: Diagnosis not present

## 2023-02-08 DIAGNOSIS — M5441 Lumbago with sciatica, right side: Secondary | ICD-10-CM | POA: Diagnosis not present

## 2023-02-12 ENCOUNTER — Telehealth: Payer: Self-pay

## 2023-02-12 NOTE — Telephone Encounter (Signed)
     Patient  visit on 2/18  at [facility]   Have you been able to follow up with your primary care physician? Yes   The patient was or was not able to obtain any needed medicine or equipment. Yes   Are there diet recommendations that you are having difficulty following? Na   Patient expresses understanding of discharge instructions and education provided has no other needs at this time.  Yes     [facility] [phone] [address], [place], [place] [address] Phone: [phone] Email: [email or web address]

## 2023-02-13 DIAGNOSIS — M549 Dorsalgia, unspecified: Secondary | ICD-10-CM | POA: Diagnosis not present

## 2023-02-13 DIAGNOSIS — F5104 Psychophysiologic insomnia: Secondary | ICD-10-CM | POA: Diagnosis not present

## 2023-02-13 DIAGNOSIS — I480 Paroxysmal atrial fibrillation: Secondary | ICD-10-CM | POA: Diagnosis not present

## 2023-02-13 DIAGNOSIS — E7800 Pure hypercholesterolemia, unspecified: Secondary | ICD-10-CM | POA: Diagnosis not present

## 2023-02-13 DIAGNOSIS — K5909 Other constipation: Secondary | ICD-10-CM | POA: Diagnosis not present

## 2023-02-13 DIAGNOSIS — K219 Gastro-esophageal reflux disease without esophagitis: Secondary | ICD-10-CM | POA: Diagnosis not present

## 2023-02-13 DIAGNOSIS — I10 Essential (primary) hypertension: Secondary | ICD-10-CM | POA: Diagnosis not present

## 2023-02-13 DIAGNOSIS — G8929 Other chronic pain: Secondary | ICD-10-CM | POA: Diagnosis not present

## 2023-02-13 DIAGNOSIS — E8881 Metabolic syndrome: Secondary | ICD-10-CM | POA: Diagnosis not present

## 2023-03-11 DIAGNOSIS — I10 Essential (primary) hypertension: Secondary | ICD-10-CM | POA: Diagnosis not present

## 2023-03-11 DIAGNOSIS — K219 Gastro-esophageal reflux disease without esophagitis: Secondary | ICD-10-CM | POA: Diagnosis not present

## 2023-03-11 DIAGNOSIS — E7800 Pure hypercholesterolemia, unspecified: Secondary | ICD-10-CM | POA: Diagnosis not present

## 2023-03-11 DIAGNOSIS — K5909 Other constipation: Secondary | ICD-10-CM | POA: Diagnosis not present

## 2023-03-11 DIAGNOSIS — E8881 Metabolic syndrome: Secondary | ICD-10-CM | POA: Diagnosis not present

## 2023-03-11 DIAGNOSIS — I480 Paroxysmal atrial fibrillation: Secondary | ICD-10-CM | POA: Diagnosis not present

## 2023-03-11 DIAGNOSIS — F5104 Psychophysiologic insomnia: Secondary | ICD-10-CM | POA: Diagnosis not present

## 2023-03-11 DIAGNOSIS — M549 Dorsalgia, unspecified: Secondary | ICD-10-CM | POA: Diagnosis not present

## 2023-03-11 DIAGNOSIS — G8929 Other chronic pain: Secondary | ICD-10-CM | POA: Diagnosis not present

## 2023-03-27 ENCOUNTER — Telehealth: Payer: Self-pay

## 2023-03-27 NOTE — Telephone Encounter (Signed)
        Patient  visited [facility] on 03/23/2023  for treatment.   Telephone encounter attempt :  1st  A HIPAA compliant voice message was left requesting a return call.  Instructed patient to call back at [phone].   Janisse Ghan [facility]  [facility] Population Health Community Resource Care Guide   [email or web address]  ?? [phone]   Website: [email or web address]  [email or web address]

## 2023-03-30 ENCOUNTER — Telehealth: Payer: Self-pay

## 2023-03-30 NOTE — Telephone Encounter (Signed)
     Patient  visit on 03/23/2023  at [facility] was for treatment.  Have you been able to follow up with your primary care physician? Yes  The patient was or was not able to obtain any needed medicine or equipment. Patient was able to obtain medication.  Are there diet recommendations that you are having difficulty following? No  Patient expresses understanding of discharge instructions and education provided has no other needs at this time. Yes   Rebecca Reynolds [facility]  [facility] Population Health Community Resource Care Guide   [email or web address]  ?? [phone]   Website: [email or web address]

## 2024-03-21 DIAGNOSIS — R0602 Shortness of breath: Secondary | ICD-10-CM | POA: Diagnosis not present

## 2024-03-22 DIAGNOSIS — I447 Left bundle-branch block, unspecified: Secondary | ICD-10-CM | POA: Diagnosis not present

## 2024-03-23 DIAGNOSIS — I447 Left bundle-branch block, unspecified: Secondary | ICD-10-CM | POA: Diagnosis not present

## 2024-03-23 DIAGNOSIS — I4891 Unspecified atrial fibrillation: Secondary | ICD-10-CM | POA: Diagnosis not present

## 2024-03-24 DIAGNOSIS — I447 Left bundle-branch block, unspecified: Secondary | ICD-10-CM | POA: Diagnosis not present

## 2024-03-24 DIAGNOSIS — I471 Supraventricular tachycardia, unspecified: Secondary | ICD-10-CM | POA: Diagnosis not present

## 2024-03-24 DIAGNOSIS — I4891 Unspecified atrial fibrillation: Secondary | ICD-10-CM | POA: Diagnosis not present

## 2024-03-25 DIAGNOSIS — I4891 Unspecified atrial fibrillation: Secondary | ICD-10-CM | POA: Diagnosis not present

## 2024-03-25 DIAGNOSIS — I447 Left bundle-branch block, unspecified: Secondary | ICD-10-CM | POA: Diagnosis not present

## 2024-03-25 DIAGNOSIS — E785 Hyperlipidemia, unspecified: Secondary | ICD-10-CM | POA: Diagnosis not present

## 2024-08-10 ENCOUNTER — Emergency Department (HOSPITAL_COMMUNITY)
Admission: EM | Admit: 2024-08-10 | Discharge: 2024-08-10 | Disposition: A | Discharge status: Home / Self Care | Attending: Emergency Medicine | Admitting: Emergency Medicine

## 2024-08-10 ENCOUNTER — Emergency Department (HOSPITAL_COMMUNITY)

## 2024-08-10 ENCOUNTER — Other Ambulatory Visit: Payer: Self-pay

## 2024-08-10 ENCOUNTER — Encounter (HOSPITAL_COMMUNITY): Payer: Self-pay

## 2024-08-10 DIAGNOSIS — G8929 Other chronic pain: Secondary | ICD-10-CM | POA: Insufficient documentation

## 2024-08-10 DIAGNOSIS — R9082 White matter disease, unspecified: Secondary | ICD-10-CM | POA: Insufficient documentation

## 2024-08-10 DIAGNOSIS — E785 Hyperlipidemia, unspecified: Secondary | ICD-10-CM | POA: Insufficient documentation

## 2024-08-10 DIAGNOSIS — R29810 Facial weakness: Secondary | ICD-10-CM | POA: Diagnosis not present

## 2024-08-10 DIAGNOSIS — M549 Dorsalgia, unspecified: Secondary | ICD-10-CM | POA: Insufficient documentation

## 2024-08-10 DIAGNOSIS — R531 Weakness: Secondary | ICD-10-CM | POA: Insufficient documentation

## 2024-08-10 DIAGNOSIS — I10 Essential (primary) hypertension: Secondary | ICD-10-CM | POA: Diagnosis not present

## 2024-08-10 DIAGNOSIS — Z79899 Other long term (current) drug therapy: Secondary | ICD-10-CM | POA: Diagnosis not present

## 2024-08-10 DIAGNOSIS — I2510 Atherosclerotic heart disease of native coronary artery without angina pectoris: Secondary | ICD-10-CM | POA: Diagnosis not present

## 2024-08-10 LAB — CBC
HCT: 35.4 % — ABNORMAL LOW (ref 36.0–46.0)
Hemoglobin: 11.7 g/dL — ABNORMAL LOW (ref 12.0–15.0)
MCH: 29.3 pg (ref 26.0–34.0)
MCHC: 33.1 g/dL (ref 30.0–36.0)
MCV: 88.5 fL (ref 80.0–100.0)
Platelets: 362 K/uL (ref 150–400)
RBC: 4 MIL/uL (ref 3.87–5.11)
RDW: 14.2 % (ref 11.5–15.5)
WBC: 7.1 K/uL (ref 4.0–10.5)
nRBC: 0 % (ref 0.0–0.2)

## 2024-08-10 LAB — DIFFERENTIAL
Abs Immature Granulocytes: 0.03 K/uL (ref 0.00–0.07)
Basophils Absolute: 0.1 K/uL (ref 0.0–0.1)
Basophils Relative: 1 %
Eosinophils Absolute: 0.2 K/uL (ref 0.0–0.5)
Eosinophils Relative: 3 %
Immature Granulocytes: 0 %
Lymphocytes Relative: 26 %
Lymphs Abs: 1.9 K/uL (ref 0.7–4.0)
Monocytes Absolute: 0.6 K/uL (ref 0.1–1.0)
Monocytes Relative: 8 %
Neutro Abs: 4.4 K/uL (ref 1.7–7.7)
Neutrophils Relative %: 62 %

## 2024-08-10 LAB — I-STAT CHEM 8, ED
BUN: 10 mg/dL (ref 8–23)
Calcium, Ion: 1.1 mmol/L — ABNORMAL LOW (ref 1.15–1.40)
Chloride: 98 mmol/L (ref 98–111)
Creatinine, Ser: 1 mg/dL (ref 0.44–1.00)
Glucose, Bld: 97 mg/dL (ref 70–99)
HCT: 35 % — ABNORMAL LOW (ref 36.0–46.0)
Hemoglobin: 11.9 g/dL — ABNORMAL LOW (ref 12.0–15.0)
Potassium: 4.2 mmol/L (ref 3.5–5.1)
Sodium: 132 mmol/L — ABNORMAL LOW (ref 135–145)
TCO2: 23 mmol/L (ref 22–32)

## 2024-08-10 LAB — COMPREHENSIVE METABOLIC PANEL WITH GFR
ALT: 17 U/L (ref 0–44)
AST: 21 U/L (ref 15–41)
Albumin: 3.4 g/dL — ABNORMAL LOW (ref 3.5–5.0)
Alkaline Phosphatase: 66 U/L (ref 38–126)
Anion gap: 11 (ref 5–15)
BUN: 10 mg/dL (ref 8–23)
CO2: 23 mmol/L (ref 22–32)
Calcium: 9 mg/dL (ref 8.9–10.3)
Chloride: 100 mmol/L (ref 98–111)
Creatinine, Ser: 0.96 mg/dL (ref 0.44–1.00)
GFR, Estimated: >60 mL/min (ref >60)
Glucose, Bld: 97 mg/dL (ref 70–99)
Potassium: 4.3 mmol/L (ref 3.5–5.1)
Sodium: 134 mmol/L — ABNORMAL LOW (ref 135–145)
Total Bilirubin: 1 mg/dL (ref 0.0–1.2)
Total Protein: 6.1 g/dL — ABNORMAL LOW (ref 6.5–8.1)

## 2024-08-10 LAB — APTT: aPTT: 25 s (ref 24–36)

## 2024-08-10 LAB — ETHANOL: Alcohol, Ethyl (B): <15 mg/dL (ref <15)

## 2024-08-10 LAB — PROTIME-INR
INR: 0.9 (ref 0.8–1.2)
Prothrombin Time: 12.9 s (ref 11.4–15.2)

## 2024-08-10 LAB — CBG MONITORING, ED: Glucose-Capillary: 95 mg/dL (ref 70–99)

## 2024-08-10 MED ORDER — SODIUM CHLORIDE 0.9% FLUSH: 3.0000 mL | Freq: Once | INTRAVENOUS | Status: DC

## 2024-08-10 NOTE — Discharge Instructions (Addendum)
 You were seen in the Emergency Department for weakness and dizziness Your blood work showed a slightly low sodium level again but no other concerning findings Your EKG looked okay CAT scan of your head and MRI of the brain did not show any evidence of strokes today Is important that you follow-up with your primary doctor within 1 week for reevaluation Continue taking all previous prescribed medications Return to the emergency department for severe headaches, severe dizziness, weakness on one side of your body or any other concerns

## 2024-08-10 NOTE — ED Notes (Signed)
 CCMD called.

## 2024-08-10 NOTE — ED Provider Notes (Signed)
 [facility] Provider Note   CSN: [id number] Arrival date & time: 08/10/24  [id number]     Patient presents with: Facial Droop, Back Pain, and Weakness   Rebecca Reynolds is a 73 y.o. female.  With a history of hypertension, hyperlipidemia, CAD and status post cervical spine fusion who presents to the ED for reported weakness.  Patient has noticed increased generalized weakness, dizziness and seems to be leaning and walking to the left.  The precise onset of the symptoms is unclear but she states it has been at least a period of weeks.  She reported some facial droop (right-sided) yesterday but this may have resolved.  Some chronic back pain and neck pain that are getting worse more recently.  She has fallen in the past but no recent falls.  She last fell a few months ago while feeding her 25 cats at home.  She lives with her husband.  Denies chest pain shortness of breath fevers chills recent infectious symptoms.    Back Pain Associated symptoms: weakness   Weakness      Prior to Admission medications   Medication Sig Start Date End Date Taking? Authorizing Provider  atorvastatin  (LIPITOR ) 80 MG tablet Take 80 mg by mouth daily. 03/21/15   [provider]  citalopram (CELEXA) 40 MG tablet Take 40 mg by mouth daily.    [provider]  cyclobenzaprine  (FLEXERIL ) 5 MG tablet Take 5 mg by mouth every 6 (six) hours.  05/30/18   [provider]  gabapentin  (NEURONTIN ) 600 MG tablet Take 600 mg by mouth 3 (three) times daily as needed (for nerve pain).  03/19/15   [provider]  lidocaine  (LIDODERM ) 5 % Place 2 patches onto the skin daily. Remove & Discard patch within 12 hours or as directed by MD 06/19/18   Fairy Frames, MD  nitroGLYCERIN (NITROSTAT) 0.4 MG SL tablet Place 0.4 mg under the tongue every 5 (five) minutes as needed for chest pain.  10/22/16   [provider]  omeprazole (PRILOSEC) 20 MG capsule  Take 20 mg by mouth 2 (two) times daily before a meal.    [provider]  senna (SENOKOT) 8.6 MG TABS tablet Take 1 tablet (8.6 mg total) by mouth 2 (two) times daily. 06/18/18   Fairy Frames, MD  zolpidem  (AMBIEN  CR) 12.5 MG CR tablet Take 12.5 mg by mouth at bedtime.     [provider]    Allergies: Wasp venom    Review of Systems  Musculoskeletal:  Positive for back pain.  Neurological:  Positive for weakness.    Updated Vital Signs BP (!) 147/78 (BP Location: Right Arm)   Pulse 63   Temp 98 F (36.7 C) (Oral)   Resp 18   Ht 5' (1.524 m)   Wt 77.1 kg   SpO2 97%   BMI 33.20 kg/m   Physical Exam Vitals and nursing note reviewed.  HENT:     Head: Normocephalic and atraumatic.  Eyes:     Pupils: Pupils are equal, round, and reactive to light.  Cardiovascular:     Rate and Rhythm: Normal rate and regular rhythm.  Pulmonary:     Effort: Pulmonary effort is normal.     Breath sounds: Normal breath sounds.  Abdominal:     Palpations: Abdomen is soft.     Tenderness: There is no abdominal tenderness.  Skin:    General: Skin is warm and dry.  Neurological:  General: No focal deficit present.     Mental Status: She is alert and oriented to person, place, and time.     Sensory: No sensory deficit.     Motor: No weakness.     Coordination: Coordination normal.  Psychiatric:        Mood and Affect: Mood normal.     (all labs ordered are listed, but only abnormal results are displayed) Labs Reviewed  CBC - Abnormal; Notable for the following components:      Result Value   Hemoglobin 11.7 (*)    HCT 35.4 (*)    All other components within normal limits  COMPREHENSIVE METABOLIC PANEL WITH GFR - Abnormal; Notable for the following components:   Sodium 134 (*)    Total Protein 6.1 (*)    Albumin 3.4 (*)    All other components within normal limits  I-STAT CHEM 8, ED - Abnormal; Notable for the following components:   Sodium 132 (*)     Calcium , Ion 1.10 (*)    Hemoglobin 11.9 (*)    HCT 35.0 (*)    All other components within normal limits  PROTIME-INR  APTT  DIFFERENTIAL  ETHANOL  CBG MONITORING, ED    EKG: EKG Interpretation Date/Time:  Wednesday August 10 2024 06:52:39 EDT Ventricular Rate:  62 PR Interval:  172 QRS Duration:  80 QT Interval:  430 QTC Calculation: 436 R Axis:   49  Text Interpretation: Normal sinus rhythm Normal ECG No previous ECGs available Confirmed by Pamella Sharper [phone]) on 08/10/2024 7:24:38 AM  Radiology: MR BRAIN WO CONTRAST Result Date: 08/10/2024 CLINICAL DATA:  Provided history: Transient ischemic attack (TIA). Dizziness. Right facial droop. EXAM: MRI HEAD WITHOUT CONTRAST TECHNIQUE: Multiplanar, multiecho pulse sequences of the brain and surrounding structures were obtained without intravenous contrast. COMPARISON:  Head CT 08/10/2024. FINDINGS: Brain: Cerebral volume is normal. Small focus of T2 FLAIR hyperintense signal abnormality within the white matter along the posterior body/atrium of the left lateral ventricle. No cortical encephalomalacia is identified. There is no acute infarct. No evidence of an intracranial mass. No chronic intracranial blood products. No extra-axial fluid collection. No midline shift. Vascular: Maintained flow voids within the proximal large arterial vessels. Skull and upper cervical spine: No focal worrisome marrow lesion. Multilevel facet arthropathy within the cervical spine at the visible levels. Sinuses/Orbits: No mass or acute finding within the imaged orbits. No significant paranasal sinus disease. IMPRESSION: 1.  No evidence of an acute intracranial abnormality. 2. Small nonspecific chronic white matter insult along the posterior body/atrium of the left lateral ventricle. 3. Otherwise unremarkable non-contrast MRI appearance of the brain. Electronically Signed   By: Rockey Childs D.O.   On: 08/10/2024 09:05   CT HEAD WO CONTRAST Result Date:  08/10/2024 CLINICAL DATA:  Facial droop.  Neuro deficit. EXAM: CT HEAD WITHOUT CONTRAST TECHNIQUE: Contiguous axial images were obtained from the base of the skull through the vertex without intravenous contrast. RADIATION DOSE REDUCTION: This exam was performed according to the departmental dose-optimization program which includes automated exposure control, adjustment of the mA and/or kV according to patient size and/or use of iterative reconstruction technique. COMPARISON:  11/26/2011 FINDINGS: Brain: There is no evidence for acute hemorrhage, hydrocephalus, mass lesion, or abnormal extra-axial fluid collection. No definite CT evidence for acute infarction. Vascular: No hyperdense vessel or unexpected calcification. Skull: No evidence for fracture. No worrisome lytic or sclerotic lesion. Sinuses/Orbits: The visualized paranasal sinuses and mastoid air cells are clear. Visualized portions of the  globes and intraorbital fat are unremarkable. Other: None. IMPRESSION: No acute intracranial abnormality. Electronically Signed   By: Camellia Candle M.D.   On: 08/10/2024 07:18     Procedures   Medications Ordered in the ED  sodium chloride  flush (NS) 0.9 % injection 3 mL (3 mLs Intravenous Not Given 08/10/24 0741)    Clinical Course as of 08/10/24 0910  Wed Aug 10, 2024  0909 MRI brain shows nonspecific white matter changes otherwise unremarkable.  No acute evidence of stroke.  Patient will be discharged with instruction for PCP follow-up.  Return precautions were discussed in detail. [MP]    Clinical Course User Index [MP] Pamella Ozell LABOR, DO                                 Medical Decision Making 73 year old female with history as above presenting to the ED for concern of some dizziness, facial drooping and subacute generalized weakness.  NIH stroke scale score of 0 on my assessment.  Afebrile slightly hypertensive.  Well-appearing.  Able to stand up and transfer to the bed on her own.  Will obtain  laboratory workup and EKG to look for any evidence of electrolyte imbalance, anemia or dysrhythmia as cause of her weakness and dizziness.  CT head without contrast is unremarkable but will need MRI to look for any evidence of potential posterior stroke small vessel stroke that might explain her dizziness and/or reported facial droop.  There is no facial droop on my exam.  Suspected for ED workup including MRI of the brain is negative she would be appropriate for discharge with PCP follow-up  Amount and/or Complexity of Data Reviewed Labs: ordered. Radiology: ordered.        Final diagnoses:  Weakness    ED Discharge Orders     None          Pamella Ozell LABOR, DO 08/10/24 9089

## 2024-08-10 NOTE — ED Notes (Signed)
 Patient transported to MRI

## 2024-08-10 NOTE — ED Triage Notes (Signed)
 Pt arrived via POV c/o left sided facial droop, back and neck pain 10/10 for both, and generalized weakness. The Dr at [facility] that the pt saw yesterday noticed th facial droop around approx noon, but no actual known well.

## 2024-11-25 DIAGNOSIS — G8929 Other chronic pain: Secondary | ICD-10-CM | POA: Insufficient documentation

## 2024-11-25 DIAGNOSIS — M1990 Unspecified osteoarthritis, unspecified site: Secondary | ICD-10-CM | POA: Insufficient documentation

## 2024-11-25 DIAGNOSIS — Z7409 Other reduced mobility: Secondary | ICD-10-CM | POA: Insufficient documentation

## 2024-11-25 DIAGNOSIS — C439 Malignant melanoma of skin, unspecified: Secondary | ICD-10-CM | POA: Insufficient documentation

## 2024-11-28 ENCOUNTER — Ambulatory Visit

## 2024-12-06 ENCOUNTER — Other Ambulatory Visit: Payer: Self-pay

## 2024-12-06 DIAGNOSIS — R079 Chest pain, unspecified: Secondary | ICD-10-CM | POA: Insufficient documentation

## 2024-12-06 DIAGNOSIS — I10 Essential (primary) hypertension: Secondary | ICD-10-CM | POA: Insufficient documentation

## 2024-12-06 DIAGNOSIS — J449 Chronic obstructive pulmonary disease, unspecified: Secondary | ICD-10-CM | POA: Insufficient documentation

## 2024-12-06 DIAGNOSIS — R600 Localized edema: Secondary | ICD-10-CM | POA: Insufficient documentation

## 2024-12-08 ENCOUNTER — Ambulatory Visit

## 2024-12-08 VITALS — BP 132/78 | HR 88 | Ht 60.0 in | Wt 191.2 lb

## 2024-12-08 DIAGNOSIS — R072 Precordial pain: Secondary | ICD-10-CM

## 2024-12-08 DIAGNOSIS — R0609 Other forms of dyspnea: Secondary | ICD-10-CM | POA: Diagnosis not present

## 2024-12-08 DIAGNOSIS — I25118 Atherosclerotic heart disease of native coronary artery with other forms of angina pectoris: Secondary | ICD-10-CM

## 2024-12-08 DIAGNOSIS — I340 Nonrheumatic mitral (valve) insufficiency: Secondary | ICD-10-CM

## 2024-12-08 DIAGNOSIS — I447 Left bundle-branch block, unspecified: Secondary | ICD-10-CM | POA: Insufficient documentation

## 2024-12-08 DIAGNOSIS — I10 Essential (primary) hypertension: Secondary | ICD-10-CM | POA: Diagnosis not present

## 2024-12-08 DIAGNOSIS — I480 Paroxysmal atrial fibrillation: Secondary | ICD-10-CM | POA: Diagnosis not present

## 2024-12-08 NOTE — Assessment & Plan Note (Signed)
 Symptoms of dyspnea on exertion Likely multifactorial. Progressive over the last several weeks. Likely related to underlying pulmonary issues given her previous instances of pneumonia. Deconditioning versus obstructive coronary artery disease versus paroxysmal arrhythmias.  -Reassess cardiac structure and function with repeat echocardiogram - Evaluate for obstructive coronary artery disease with cardiac CT coronary angiogram as discussed above under cardiac CT. - Zio patch for 14 days to rule out any significant arrhythmias and any significant paroxysmal atrial fibrillation burden. - Continue furosemide 20 mg once daily. - Discontinue salt tablets [has been taking sodium chloride  1 g a day].  Advised to cut down salt intake in her diet.

## 2024-12-08 NOTE — Assessment & Plan Note (Signed)
 Lipid panel from 11/15/2024 total cholesterol 184, triglycerides 99, HDL 81 and LDL 86  Continue atorvastatin  80 mg once daily. He will review results of cardiac CT once available and if significant coronary atherosclerotic burden will add Zetia and/or PCSK9 inhibitors.

## 2024-12-08 NOTE — Assessment & Plan Note (Signed)
 Mild MR reported on prior echocardiogram from [facility] March 2025. Will continue to monitor.

## 2024-12-08 NOTE — Assessment & Plan Note (Signed)
 Rate dependent LBBB previously noted during inpatient stay on 30 July and also hospital in March 2025.  Also similar findings were described in 2022 based on echocardiogram findings.  Currently in the office EKG narrow QRS. Zio patch 14-day being ordered to assess for paroxysmal A-fib burden.

## 2024-12-08 NOTE — Assessment & Plan Note (Signed)
 Initial diagnosis intermittent episodes of paroxysmal A-fib diagnosed in the setting of COVID-pneumonia February 2022 at [facility]. - [facility] 03-20-2024 in the setting of community-acquired pneumonia, paroxysmal A-fib episodes on telemetry associated with rate dependent LBBB morphology QRS.  CHA2DS2-VASc score 3. Was discharged on Eliquis in April 2025 she discontinued after 1 month due to cost affordability. Has remained on aspirin.  Overall appears to be low burden of A-fib in the setting of pneumonia. She also does not appear to be at fall risk.  Will assess with Zio patch monitor for 14 days. If she does not have any significant A-fib burden, we will strongly encourage her to consider resuming anticoagulation for stroke prophylaxis.

## 2024-12-08 NOTE — Assessment & Plan Note (Signed)
 nonobstructive coronary artery disease based on cardiac cath 11/05/2016 at [facility] with 40% LAD lesion.  Given her symptoms of chest pressure on exertion associated with dyspnea on exertion, we will proceed with further evaluation for any obstructive coronary artery disease.  Discussed further evaluation with cardiac CT coronary angiogram in this context and she is agreeable. Use of IV contrast, and a dose of metoprolol for rate optimization, radiation exposure discussed and she is agreeable.  Continue aspirin 81 mg once daily. Continue atorvastatin  80 mg once daily.

## 2024-12-08 NOTE — Progress Notes (Signed)
 Cardiology Consultation:    Date:  12/08/2024   ID:  Rebecca Reynolds, DOB 1951/11/19, MRN [id number]  PCP:  Rebecca Chow, MD  Cardiologist:  Rebecca SAUNDERS Andra Heslin, MD   Referring MD: Rebecca Chow, MD   No chief complaint on file.    ASSESSMENT AND PLAN:   Ms. Rebecca Reynolds 73 year old woman with paroxysmal atrial fibrillation [both instances were in the setting of pneumonia 1 was February 2022 with COVID-19 pneumonia and recently March 2025 community-acquired pneumonia], nonobstructive CAD [cardiac cath 11/05/2016 at [facility] 40% LAD lesion], rate dependent LBBB, mild MR zita 03/21/2024 at [place]], hypertension, hyperlipidemia, S/p cervical spine fusion surgery, depression, chronic low back pain, GERD, large hiatal hernia, obesity, non-smoker, consumes 1 alcoholic drink a night, last echocardiogram (March 2025 at [facility] with normal biventricular function LVEF 55 to 60%.   As seen previously for inpatient consult tadalafil the hospital in April 2025. Here now to establish care as outpatient for progressive symptoms of shortness of breath.  Problem List Items Addressed This Visit       Cardiovascular and Mediastinum   CAD (coronary artery disease)   nonobstructive coronary artery disease based on cardiac cath 11/05/2016 at [facility] with 40% LAD lesion.  Given her symptoms of chest pressure on exertion associated with dyspnea on exertion, we will proceed with further evaluation for any obstructive coronary artery disease.  Discussed further evaluation with cardiac CT coronary angiogram in this context and she is agreeable. Use of IV contrast, and a dose of metoprolol for rate optimization, radiation exposure discussed and she is agreeable.  Continue aspirin 81 mg once daily. Continue atorvastatin  80 mg once daily.        Relevant Medications   aspirin EC 81 MG tablet   metoprolol tartrate (LOPRESSOR) 100 MG tablet   Paroxysmal atrial fibrillation (HCC)   Initial  diagnosis intermittent episodes of paroxysmal A-fib diagnosed in the setting of COVID-pneumonia February 2022 at [facility]. - [facility] 03-20-2024 in the setting of community-acquired pneumonia, paroxysmal A-fib episodes on telemetry associated with rate dependent LBBB morphology QRS.  CHA2DS2-VASc score 3. Was discharged on Eliquis in April 2025 she discontinued after 1 month due to cost affordability. Has remained on aspirin.  Overall appears to be low burden of A-fib in the setting of pneumonia. She also does not appear to be at fall risk.  Will assess with Zio patch monitor for 14 days. If she does not have any significant A-fib burden, we will strongly encourage her to consider resuming anticoagulation for stroke prophylaxis.       Relevant Medications   aspirin EC 81 MG tablet   metoprolol tartrate (LOPRESSOR) 100 MG tablet   Mitral regurgitation   Mild MR reported on prior echocardiogram from [facility] March 2025. Will continue to monitor.      Relevant Medications   aspirin EC 81 MG tablet   metoprolol tartrate (LOPRESSOR) 100 MG tablet   Benign essential hypertension - Primary   Well-controlled on current treatment with metoprolol tartrate 25 mg twice daily Amlodipine 5 mg once daily.  Was started on furosemide 20 mg once daily by PCP given the symptoms of shortness of breath. Continue same. Cut down salt intake. Will discontinue sodium chloride  tablets that were started in April at discharge from [facility] for hyponatremia noted in the setting of pneumonia.  Her recent sodium levels were normal. Likely contributing to blood pressures and fluid retention. Discontinue sodium chloride  tablets.  Relevant Medications   aspirin EC 81 MG tablet   metoprolol tartrate (LOPRESSOR) 100 MG tablet   Other Relevant Orders   EKG 12-Lead (Completed)   Basic metabolic panel with GFR   LBBB (left bundle branch  block), rate dependent   Rate dependent LBBB previously noted during inpatient stay on 30 July and also hospital in March 2025.  Also similar findings were described in 2022 based on echocardiogram findings.  Currently in the office EKG narrow QRS. Zio patch 14-day being ordered to assess for paroxysmal A-fib burden.       Relevant Medications   aspirin EC 81 MG tablet   metoprolol tartrate (LOPRESSOR) 100 MG tablet     Other   Chest pain   Relevant Orders   LONG TERM MONITOR (3-14 DAYS)   ECHOCARDIOGRAM COMPLETE   CT CORONARY MORPH W/CTA COR W/SCORE W/CA W/CM &/OR WO/CM   Dyspnea on exertion   Symptoms of dyspnea on exertion Likely multifactorial. Progressive over the last several weeks. Likely related to underlying pulmonary issues given her previous instances of pneumonia. Deconditioning versus obstructive coronary artery disease versus paroxysmal arrhythmias.  -Reassess cardiac structure and function with repeat echocardiogram - Evaluate for obstructive coronary artery disease with cardiac CT coronary angiogram as discussed above under cardiac CT. - Zio patch for 14 days to rule out any significant arrhythmias and any significant paroxysmal atrial fibrillation burden. - Continue furosemide 20 mg once daily. - Discontinue salt tablets [has been taking sodium chloride  1 g a day].  Advised to cut down salt intake in her diet.       Return to clinic tentatively in 6 to 8 weeks to review test results and decision regarding anticoagulation.  History of Present Illness:    Rebecca Reynolds is a 73 y.o. female who is being seen today for the evaluation of chest pain, and establishing care at the request of Rebecca Chow, MD.  Previously followed up with cardiologist at [facility] Dr. Claudene last visit February 2022. I have seen in for cardiology consult at [facility] inpatient 03/24/2024.  Pleasant woman here for visit by herself.    Has history of atrial fibrillation  [new diagnosis in setting of COVID pneumonia, paroxysmal, February 2022 at [facility]; ], nonobstructive CAD [cardiac cath 11/05/2016 at [facility] 40% LAD lesion], rate dependent LBBB, mild MR zita 03/21/2024 at [place]], hypertension, hyperlipidemia, S/p cervical spine fusion surgery, depression, chronic low back pain, GERD, large hiatal hernia, obesity, non-smoker, consumes 1 alcoholic drink a night, last echocardiogram (March 2025 at [facility] with normal biventricular function LVEF 55 to 60%.  Was admitted to [facility] 03-20-2024 for symptoms of chest congestion Initial EKG in the ER 03/20/2024 7:11 p.m. showed sinus rhythm with left bundle branch block morphology with his new comparison to her prior EKG in the chart from October 2023.   repeated EKG showed similar findings sinus rhythm with LBBB.   Chest x-ray showed right lower lobe opacity. Chest CT 03/21/2024 noted large hiatal hernia with findings suggestive of reflux esophagitis. EKG from 03/23/2024 done around 11:37 p.m. showed irregularly irregular RR interval with wide QRS consistent with her prior LBBB morphology consistent with atrial fibrillation with RVR at 120 beats per minute.   Telemetry reviewed at the time noted frequent short runs of atrial atrial fibrillation, and later sustained episode overnight for couple hours. Narrow QRS when heart rates slowed down.  Findings were suggestive of rate dependent bundle branch block  with paroxysmal runs of AFib. She also had short runs of NSVT without any sustained ventricular tachycardia episodes. She remained asymptomatic and hemodynamically stable throughout the sustained AFib episode. Echocardiogram showed normal biventricular function with LVEF 55-60% and mildly dilated left atrium  She was treated for community-acquired pneumonia and was discharged on Eliquis for anticoagulation.  However she stopped this after 1 month given the cost.  Has  remained on aspirin 81 mg ever since.  Also with hyponatremia noted during inpatient stay she was discharged on sodium chloride  1 g tablet today.  Hydrochlorothiazide was discontinued.  Recently had an evaluation on 08/10/2024 in the ER for dizziness, MRI brain showed no acute abnormality.  Overall her symptoms are notable for dyspnea on exertion.  Has been progressive over the last several weeks.  Describes a sensation of fluttering in the chest but can last for several seconds. Associated with mild bilateral lower extremity edema which is improved after PCP started her on Lasix 20 mg once daily. No syncopal or near syncopal episodes.  She describes two types of chest pain 1 which is obviously musculoskeletal with the shoulder pain, further aggravated recently while she had to suddenly grab on with her left hand to prevent a fall. She describes a sense of chest pressure and shortness of breath at times, which occurs with exertion.  She describes balance is occasionally off and had sustained falls.  Last fall was over several weeks ago, mechanical, loss of balance.  She has been under significant stress since untimely death of her 42 year old son couple years ago, he was dealing with mental health issues and was found in his room on the floor, autopsy yielded no obvious cause.  Ever since then she has been dealing with the grief. Consumes 1 glass of alcoholic drink at night to help her with sleep. Does not smoke. No other illicit drug use.  EKG in the clinic today shows sinus rhythm heart rate 88/min, PR interval 174 ms, QRS duration 82 ms, QTc 467 ms, no ischemic changes.  Blood work from 11/15/2024 at PCPs office noted BUN 12, creatinine 1.13, eGFR 52. Sodium 139, potassium 4.8 Normal transaminases and alkaline phosphatase Lipid panel total cholesterol 184, triglycerides 99, HDL 81 and LDL 86  Last CBC reviewed from 08-10-2024 hemoglobin 11.7, hematocrit 35.4 appears consistent with mild  chronic anemia.  Past Medical History:  Diagnosis Date   A-fib (HCC) 01/26/2021   During COVID-19 PNA 01/26/2021.     Anxiety    ANXIETY 07/11/2009   Qualifier: Diagnosis of   By: Germaine LPN, Megan      Replacing diagnoses that were inactivated after the 03/23/23 regulatory import     Arthritis    hands, maybe some in my knees and back (06/17/2018)   BACK PAIN, CHRONIC 07/11/2009   Qualifier: Diagnosis of   By: Germaine LPN, Megan      Replacing diagnoses that were inactivated after the 03/23/23 regulatory import     Benign essential hypertension    CAD (nonobstructive, mild LAD per cath 2017)    CARPAL TUNNEL SYNDROME 07/11/2009   Qualifier: Diagnosis of   By: Germaine LPN, Megan         Chest pain    Chronic left hip pain    Chronic upper back pain    COPD (chronic obstructive pulmonary disease) (HCC)    COUGH 07/11/2009   Qualifier: Diagnosis of   By: Germaine LPN, Megan         Degenerative disc disease, cervical  03/11/2013   Depression    Gait instability 06/17/2018   GERD (gastroesophageal reflux disease)    Herniated lumbar intervertebral disc 07/01/2018   Hypercholesteremia    Hypertension    HYPERTENSION 07/11/2009   Qualifier: Diagnosis of   By: Germaine LPN, Megan      Replacing diagnoses that were inactivated after the 03/23/23 regulatory import     Impaired functional mobility, balance, gait, and endurance 11/25/2024   INSOMNIA 07/11/2009   Qualifier: Diagnosis of   By: Germaine LPN, Megan         Long term current use of opiate analgesic 03/11/2013   Low back pain radiating to lower extremity    effects BLE; right side is extreme right now (06/17/2018)   Lower extremity edema    Lumbar radicular pain 03/28/2013   Melanoma (HCC)    cut off my face   Mitral regurgitation 01/28/2021   Echo 01/28/2021: Irregular heart rhythm.  Normal LV size, wall thickness,   systolic function with EF 50-55%.  Paradoxical septal motion c/w LBBB.    RV/RA/LA nl.  No AS/AI.  Mild to  moderate MR with eccentrically directed   jet.  Unable to determine RVSP.  Normal aortic root.  IVC not visualized.    No effusion.     Obesity, Class II, BMI 35-39.9    RESTLESS LEG SYNDROME 07/11/2009   Qualifier: Diagnosis of   By: Germaine LPN, Megan          Past Surgical History:  Procedure Laterality Date   BACK SURGERY     CESAREAN SECTION  1989   DILATION AND CURETTAGE OF UTERUS     MELANOMA EXCISION     face   OOPHORECTOMY     POSTERIOR FUSION CERVICAL SPINE  ~ 1997   TUBAL LIGATION      Current Medications: Active Medications[1]   Allergies:   Wasp venom   Social History   Socioeconomic History   Marital status: Married    Spouse name: Not on file   Number of children: 4   Years of education: 11   Highest education level: Not on file  Occupational History   Occupation: disability   Tobacco Use   Smoking status: Never   Smokeless tobacco: Never  Vaping Use   Vaping status: Never Used  Substance and Sexual Activity   Alcohol use: Yes    Alcohol/week: 0.0 standard drinks of alcohol    Comment: 06/17/2018 might have 6 drinks/year; if that   Drug use: Never   Sexual activity: Not Currently  Other Topics Concern   Not on file  Social History Narrative   Lives with son    Drinks a pepsi a day   Right handed    Social Drivers of Health   Tobacco Use: Low Risk (12/08/2024)   Patient History    Smoking Tobacco Use: Never    Smokeless Tobacco Use: Never    Passive Exposure: Not on file  Financial Resource Strain: Not on file  Food Insecurity: Not on file  Transportation Needs: Not on file  Physical Activity: Not on file  Stress: Not on file  Social Connections: Not on file  Depression (EYV7-0): Not on file  Alcohol Screen: Not on file  Housing: Not on file  Utilities: Not on file  Health Literacy: Not on file     Family History: The patient's family history includes Diabetes in her brother, mother, sister, and sister; Heart attack in her  father; Hyperlipidemia in her brother,  sister, and sister. ROS:   Please see the history of present illness.    All 14 point review of systems negative except as described per history of present illness.  EKGs/Labs/Other Studies Reviewed:    The following studies were reviewed today:   EKG:  EKG Interpretation Date/Time:  Thursday December 08 2024 10:51:51 EST Ventricular Rate:  88 PR Interval:  174 QRS Duration:  82 QT Interval:  386 QTC Calculation: 467 R Axis:   60  Text Interpretation: Normal sinus rhythm Normal ECG When compared with ECG of 10-Aug-2024 06:52, Nonspecific T wave abnormality now evident in Inferior leads Confirmed by Liborio Hai reddy [phone]) on 12/08/2024 11:06:30 AM    Recent Labs: 08/10/2024: ALT 17; BUN 10; Creatinine, Ser 1.00; Hemoglobin 11.9; Platelets 362; Potassium 4.2; Sodium 132  Recent Lipid Panel No results found for: CHOL, TRIG, HDL, CHOLHDL, VLDL, LDLCALC, LDLDIRECT  Physical Exam:    VS:  BP 132/78   Pulse 88   Ht 5' (1.524 m)   Wt 191 lb 3.2 oz (86.7 kg)   SpO2 97%   BMI 37.34 kg/m     Wt Readings from Last 3 Encounters:  12/08/24 191 lb 3.2 oz (86.7 kg)  08/10/24 170 lb (77.1 kg)  06/17/18 195 lb 12.3 oz (88.8 kg)     GENERAL:  Well nourished, well developed in no acute distress NECK: No JVD; No carotid bruits CARDIAC: RRR, S1 and S2 present, no murmurs, no rubs, no gallops CHEST:  Clear to auscultation without rales, wheezing or rhonchi  Extremities: No pitting pedal edema. Pulses bilaterally symmetric with radial 2+ and dorsalis pedis 2+ NEUROLOGIC:  Alert and oriented x 3  Medication Adjustments/Labs and Tests Ordered: Current medicines are reviewed at length with the patient today.  Concerns regarding medicines are outlined above.  Orders Placed This Encounter  Procedures   CT CORONARY MORPH W/CTA COR W/SCORE W/CA W/CM &/OR WO/CM   Basic metabolic panel with GFR   LONG TERM MONITOR (3-14 DAYS)   EKG  12-Lead   ECHOCARDIOGRAM COMPLETE   Meds ordered this encounter  Medications   metoprolol tartrate (LOPRESSOR) 100 MG tablet    Sig: Take 1 tablet (100 mg total) by mouth once. Take 2 hours prior to your CT if your heart rate is greater than 55    Dispense:  1 tablet    Refill:  0    Signed, Ayliana Casciano reddy Wilbur Labuda, MD, MPH, [facility]. 12/08/2024 1:41 PM    [facility]     [1]  Current Meds  Medication Sig   albuterol (VENTOLIN HFA) 108 (90 Base) MCG/ACT inhaler Inhale 2 puffs into the lungs 4 (four) times daily as needed for wheezing or shortness of breath.   ALPRAZolam (XANAX) 0.5 MG tablet Take 0.5 mg by mouth 2 (two) times daily as needed for anxiety or sleep.   amLODipine (NORVASC) 5 MG tablet Take 5 mg by mouth daily.   aspirin EC 81 MG tablet Take 81 mg by mouth daily. Swallow whole.   atorvastatin  (LIPITOR ) 80 MG tablet Take 80 mg by mouth daily.   citalopram (CELEXA) 40 MG tablet Take 40 mg by mouth daily.   escitalopram (LEXAPRO) 10 MG tablet Take 10 mg by mouth daily.   furosemide (LASIX) 20 MG tablet Take 20 mg by mouth daily.   gabapentin  (NEURONTIN ) 600 MG tablet Take 600 mg by mouth as needed (pain).   metoprolol tartrate (LOPRESSOR) 100 MG tablet Take 1 tablet (100 mg total)  by mouth once. Take 2 hours prior to your CT if your heart rate is greater than 55   metoprolol tartrate (LOPRESSOR) 25 MG tablet Take 25 mg by mouth daily.   nitroGLYCERIN (NITROSTAT) 0.4 MG SL tablet Place 0.4 mg under the tongue every 5 (five) minutes as needed for chest pain.    oxyCODONE -acetaminophen  (PERCOCET) 10-325 MG tablet Take 1 tablet by mouth every 8 (eight) hours as needed for pain.   pantoprazole  (PROTONIX ) 40 MG tablet Take 40 mg by mouth daily.   zolpidem  (AMBIEN  CR) 12.5 MG CR tablet Take 12.5 mg by mouth at bedtime.    [DISCONTINUED] omeprazole (PRILOSEC) 20 MG capsule Take 20 mg by mouth 2 (two) times daily before a meal.   [DISCONTINUED] sodium  chloride 1 g tablet Take 1 g by mouth daily.

## 2024-12-08 NOTE — Patient Instructions (Signed)
 Medication Instructions:  Your physician has recommended you make the following change in your medication:   Stop sodium chloride    *If you need a refill on your cardiac medications before your next appointment, please call your pharmacy*   Lab Work: None ordered If you have labs (blood work) drawn today and your tests are completely normal, you will receive your results only by: MyChart Message (if you have MyChart) OR A paper copy in the mail If you have any lab test that is abnormal or we need to change your treatment, we will call you to review the results.  Testing/Procedures: Your physician has requested that you have an echocardiogram. Echocardiography is a painless test that uses sound waves to create images of your heart. It provides your doctor with information about the size and shape of your heart and how well your hearts chambers and valves are working. This procedure takes approximately one hour. There are no restrictions for this procedure. Please do NOT wear cologne, perfume, aftershave, or lotions (deodorant is allowed). Please arrive 15 minutes prior to your appointment time.  Please note: We ask at that you not bring children with you during ultrasound (echo/ vascular) testing. Due to room size and safety concerns, children are not allowed in the ultrasound rooms during exams. Our front office staff cannot provide observation of children in our lobby area while testing is being conducted. An adult accompanying a patient to their appointment will only be allowed in the ultrasound room at the discretion of the ultrasound technician under special circumstances. We apologize for any inconvenience.  A zio monitor was ordered today. It will remain on for 14 days. Remove 12/22/2024. You will then return monitor and event diary in provided box. It takes 1-2 weeks for report to be downloaded and returned to us . We will call you with the results. If monitor falls off or has orange  flashing light, please call Zio for further instructions.    Your cardiac CT will be scheduled at one of the below locations:   [facility] [address] [place], [place] [phone]   Please follow these instructions carefully (unless otherwise directed):  An IV will be required for this test and Nitroglycerin will be given.   On the Night Before the Test: Be sure to Drink plenty of water. Do not consume any caffeinated/decaffeinated beverages or chocolate 12 hours prior to your test. Do not take any antihistamines 12 hours prior to your test.  On the Day of the Test: Drink plenty of water until 1 hour prior to the test. Do not eat any food 1 hour prior to test. You may take your regular medications prior to the test.  Take metoprolol (Lopressor) 100 mg two hours prior to test. If you take Furosemide, please HOLD on the morning of the test. FEMALES- please wear underwire-free bra if available, avoid dresses & tight clothing      After the Test: Drink plenty of water. After receiving IV contrast, you may experience a mild flushed feeling. This is normal. On occasion, you may experience a mild rash up to 24 hours after the test. This is not dangerous. If this occurs, you can take Benadryl 25 mg, Zyrtec, Claritin, or Allegra and increase your fluid intake. (Patients taking Tikosyn should avoid Benadryl, and may take Zyrtec, Claritin, or Allegra) If you experience trouble breathing, this can be serious. If it is severe call 911 IMMEDIATELY. If it is mild, please call our office.  We will  call to schedule your test 2-4 weeks out understanding that some insurance companies will need an authorization prior to the service being performed.   For more information and frequently asked questions, please visit our website : [email or web address]  For non-scheduling related questions, please contact the cardiac imaging nurse navigator should you have any  questions/concerns: Cardiac Imaging Nurse Navigators Direct Office Dial: [phone]   For scheduling needs, including cancellations and rescheduling, please call Brittany, [phone].  Follow-Up: At [facility], you and your health needs are our priority.  As part of our continuing mission to provide you with exceptional heart care, we have created designated Provider Care Teams.  These Care Teams include your primary Cardiologist (physician) and Advanced Practice Providers (APPs -  Physician Assistants and Nurse Practitioners) who all work together to provide you with the care you need, when you need it.  We recommend signing up for the patient portal called MyChart.  Sign up information is provided on this After Visit Summary.  MyChart is used to connect with patients for Virtual Visits (Telemedicine).  Patients are able to view lab/test results, encounter notes, upcoming appointments, etc.  Non-urgent messages can be sent to your provider as well.   To learn more about what you can do with MyChart, go to [email or web address].    Your next appointment:   2 month(s)  The format for your next appointment:   In Person  Provider:   Alean Madireddy, MD   Other Instructions Echocardiogram An echocardiogram is a test that uses sound waves (ultrasound) to produce images of the heart. Images from an echocardiogram can provide important information about: Heart size and shape. The size and thickness and movement of your heart's walls. Heart muscle function and strength. Heart valve function or if you have stenosis. Stenosis is when the heart valves are too narrow. If blood is flowing backward through the heart valves (regurgitation). A tumor or infectious growth around the heart valves. Areas of heart muscle that are not working well because of poor blood flow or injury from a heart attack. Aneurysm detection. An aneurysm is a weak or damaged part of an artery wall. The wall  bulges out from the normal force of blood pumping through the body. Tell a health care provider about: Any allergies you have. All medicines you are taking, including vitamins, herbs, eye drops, creams, and over-the-counter medicines. Any blood disorders you have. Any surgeries you have had. Any medical conditions you have. Whether you are pregnant or may be pregnant. What are the risks? Generally, this is a safe test. However, problems may occur, including an allergic reaction to dye (contrast) that may be used during the test. What happens before the test? No specific preparation is needed. You may eat and drink normally. What happens during the test? You will take off your clothes from the waist up and put on a hospital gown. Electrodes or electrocardiogram (ECG)patches may be placed on your chest. The electrodes or patches are then connected to a device that monitors your heart rate and rhythm. You will lie down on a table for an ultrasound exam. A gel will be applied to your chest to help sound waves pass through your skin. A handheld device, called a transducer, will be pressed against your chest and moved over your heart. The transducer produces sound waves that travel to your heart and bounce back (or echo back) to the transducer. These sound waves will be captured in real-time and changed into images of your heart  that can be viewed on a video monitor. The images will be recorded on a computer and reviewed by your health care provider. You may be asked to change positions or hold your breath for a short time. This makes it easier to get different views or better views of your heart. In some cases, you may receive contrast through an IV in one of your veins. This can improve the quality of the pictures from your heart. The procedure may vary among health care providers and hospitals.   What can I expect after the test? You may return to your normal, everyday life, including diet,  activities, and medicines, unless your health care provider tells you not to do that. Follow these instructions at home: It is up to you to get the results of your test. Ask your health care provider, or the department that is doing the test, when your results will be ready. Keep all follow-up visits. This is important. Summary An echocardiogram is a test that uses sound waves (ultrasound) to produce images of the heart. Images from an echocardiogram can provide important information about the size and shape of your heart, heart muscle function, heart valve function, and other possible heart problems. You do not need to do anything to prepare before this test. You may eat and drink normally. After the echocardiogram is completed, you may return to your normal, everyday life, unless your health care provider tells you not to do that. This information is not intended to replace advice given to you by your health care provider. Make sure you discuss any questions you have with your health care provider. Document Revised: 07/31/2020 Document Reviewed: 07/31/2020 Elsevier Patient Education  2021 [employer]vier [employer].   Important Information About Sugar

## 2024-12-08 NOTE — Assessment & Plan Note (Signed)
 Well-controlled on current treatment with metoprolol tartrate 25 mg twice daily Amlodipine 5 mg once daily.  Was started on furosemide 20 mg once daily by PCP given the symptoms of shortness of breath. Continue same. Cut down salt intake. Will discontinue sodium chloride  tablets that were started in April at discharge from [facility] for hyponatremia noted in the setting of pneumonia.  Her recent sodium levels were normal. Likely contributing to blood pressures and fluid retention. Discontinue sodium chloride  tablets.

## 2024-12-09 LAB — BASIC METABOLIC PANEL WITH GFR
BUN/Creatinine Ratio: 10 — ABNORMAL LOW (ref 12–28)
BUN: 10 mg/dL (ref 8–27)
CO2: 25 mmol/L (ref 20–29)
Calcium: 9.8 mg/dL (ref 8.7–10.3)
Chloride: 100 mmol/L (ref 96–106)
Creatinine, Ser: 0.98 mg/dL (ref 0.57–1.00)
Glucose: 100 mg/dL — ABNORMAL HIGH (ref 70–99)
Potassium: 3.9 mmol/L (ref 3.5–5.2)
Sodium: 140 mmol/L (ref 134–144)
eGFR: 61 mL/min/1.73

## 2024-12-13 ENCOUNTER — Ambulatory Visit

## 2024-12-13 DIAGNOSIS — R072 Precordial pain: Secondary | ICD-10-CM | POA: Diagnosis not present

## 2024-12-13 LAB — ECHOCARDIOGRAM COMPLETE
AR max vel: 2.43 cm2
AV Area VTI: 2.48 cm2
AV Area mean vel: 2.5 cm2
AV Mean grad: 5 mmHg
AV Peak grad: 8.8 mmHg
Ao pk vel: 1.48 m/s
MV M vel: 2.48 m/s
MV Peak grad: 24.6 mmHg
S' Lateral: 2.85 cm

## 2025-01-04 ENCOUNTER — Telehealth (HOSPITAL_COMMUNITY): Payer: Self-pay | Admitting: *Deleted

## 2025-01-04 NOTE — Telephone Encounter (Signed)
 Reaching out to patient to offer assistance regarding upcoming cardiac imaging study; pt verbalizes understanding of appt date/time, parking situation and where to check in, pre-test NPO status and medications ordered, and verified current allergies; name and call back number provided for further questions should they arise Sid Seats RN Navigator Cardiac Imaging [facility] [phone] office [phone] cell

## 2025-01-05 ENCOUNTER — Ambulatory Visit: Payer: Self-pay

## 2025-01-05 ENCOUNTER — Other Ambulatory Visit (HOSPITAL_BASED_OUTPATIENT_CLINIC_OR_DEPARTMENT_OTHER): Admitting: Radiology

## 2025-01-05 DIAGNOSIS — R072 Precordial pain: Secondary | ICD-10-CM

## 2025-01-06 NOTE — Telephone Encounter (Signed)
-----   Message from Alean Kobus, MD sent at 01/05/2025  5:10 PM EST ----- Heart monitor results show paroxysmal A-fib. She will need to be started on anticoagulation.  She previously discontinued Eliquis due to cost concerns. Please discuss with her and if agreeable send a prescription for either Eliquis or Xarelto depending on cost coverage and assistance available.  Also inform her that the echocardiogram results showed normal pumping function of the heart with mild increase stiffness which is an age-related change.  No major valve abnormalities. Trace to small amount of fluid around the heart was reported, on my review of images this is minimal and not a significant concern.  Will review further with her in person at follow-up visit. Also please request her to keep her appointment for the cardiac CT currently scheduled for February 5th and follow-up office visit with me currently scheduled for February 18.  Thank you.   Please forward echocardiogram and heart monitor results to her PCP.  Thank you

## 2025-01-25 ENCOUNTER — Telehealth (HOSPITAL_COMMUNITY): Payer: Self-pay | Admitting: Emergency Medicine

## 2025-01-25 NOTE — Telephone Encounter (Signed)
 Unable to leave vm Rockwell Alexandria RN Navigator Cardiac Imaging [facility] [phone] Office  [phone] Cell

## 2025-01-26 ENCOUNTER — Inpatient Hospital Stay (HOSPITAL_BASED_OUTPATIENT_CLINIC_OR_DEPARTMENT_OTHER): Admission: RE | Admit: 2025-01-26 | Source: Ambulatory Visit | Admitting: Radiology

## 2025-01-26 NOTE — Progress Notes (Signed)
 Patient did not show up for scheduled scan at 11am

## 2025-02-08 ENCOUNTER — Ambulatory Visit

## 2025-02-23 ENCOUNTER — Other Ambulatory Visit (HOSPITAL_BASED_OUTPATIENT_CLINIC_OR_DEPARTMENT_OTHER): Admitting: Radiology
